# Patient Record
Sex: Female | Born: 1995
Health system: Southern US, Community
[De-identification: ages and names within clinical notes are randomized; demographics above are authoritative.]

## PROBLEM LIST (undated history)

## (undated) ENCOUNTER — Emergency Department (HOSPITAL_COMMUNITY): Admission: EM | Payer: Self-pay | Source: Home / Self Care

## (undated) ENCOUNTER — Inpatient Hospital Stay (HOSPITAL_COMMUNITY): Payer: Self-pay

## (undated) DIAGNOSIS — Z639 Problem related to primary support group, unspecified: Secondary | ICD-10-CM

## (undated) DIAGNOSIS — L309 Dermatitis, unspecified: Secondary | ICD-10-CM

## (undated) DIAGNOSIS — D649 Anemia, unspecified: Secondary | ICD-10-CM

## (undated) HISTORY — PX: OTHER SURGICAL HISTORY: SHX169

## (undated) HISTORY — DX: Problem related to primary support group, unspecified: Z63.9

---
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
## 2015-01-18 ENCOUNTER — Ambulatory Visit (INDEPENDENT_AMBULATORY_CARE_PROVIDER_SITE_OTHER): Payer: BLUE CROSS/BLUE SHIELD | Admitting: Family Medicine

## 2015-01-18 VITALS — BP 126/80 | HR 85 | Temp 97.9°F | Resp 17 | Ht 65.0 in | Wt 186.0 lb

## 2015-01-18 DIAGNOSIS — Z3009 Encounter for other general counseling and advice on contraception: Secondary | ICD-10-CM | POA: Diagnosis not present

## 2015-01-18 DIAGNOSIS — L309 Dermatitis, unspecified: Secondary | ICD-10-CM | POA: Diagnosis not present

## 2015-01-18 DIAGNOSIS — N898 Other specified noninflammatory disorders of vagina: Secondary | ICD-10-CM

## 2015-01-18 LAB — POCT URINE PREGNANCY: PREG TEST UR: NEGATIVE

## 2015-01-18 LAB — POC MICROSCOPIC URINALYSIS (UMFC): Mucus: ABSENT

## 2015-01-18 LAB — POCT URINALYSIS DIP (MANUAL ENTRY)
Bilirubin, UA: NEGATIVE
Blood, UA: NEGATIVE
Glucose, UA: NEGATIVE
Leukocytes, UA: NEGATIVE
NITRITE UA: NEGATIVE
PH UA: 7
Spec Grav, UA: 1.02
UROBILINOGEN UA: 0.2

## 2015-01-18 LAB — POCT WET + KOH PREP
Trich by wet prep: ABSENT
YEAST BY WET PREP: ABSENT
Yeast by KOH: ABSENT

## 2015-01-18 MED ORDER — HYDROCORTISONE VALERATE 0.2 % EX OINT: 1.0000 "application " | TOPICAL_OINTMENT | Freq: Two times a day (BID) | CUTANEOUS | Status: DC

## 2015-01-18 MED ORDER — TRIAMCINOLONE ACETONIDE 0.1 % EX CREA: 1.0000 "application " | TOPICAL_CREAM | Freq: Four times a day (QID) | CUTANEOUS | Status: DC

## 2015-01-18 MED ORDER — FLUCONAZOLE 150 MG PO TABS: 150.0000 mg | ORAL_TABLET | Freq: Once | ORAL | Status: DC

## 2015-01-18 NOTE — Progress Notes (Signed)
Subjective:    Patient ID: Tammy Cruz, female    DOB: Jun 10, 1995, 19 y.o.   MRN: [id number] By signing my name below, I, Littie Deeds, attest that this documentation has been prepared under the direction and in the presence of Norberto Sorenson, MD.  Electronically Signed: Littie Deeds, Medical Scribe. 01/18/2015. 3:46 PM.  Chief Complaint  Patient presents with  . Eczema    HPI HPI Comments: Stepahnie Cruz is a 19 y.o. female with a history of eczema who presents to the [facility] complaining of gradual onset, eczematous rash to her bilateral arms and legs that started a few weeks ago after she ran out of her hydrocortisone valerate. Patient notes that she has bad breakouts of eczema when she runs out of the medication; however, she notes that her current breakout is worse than usual. She has been using OTC hydrocortisone and Vaseline but without relief.  Patient also reports having heavy, non-odorous vaginal discharge that started a few weeks ago. She has not tried any OTC medications. She denies vaginal itching, vaginal pain, and dysuria. Patient is sexually active, but she is not currently on birth control and does not remember when her last menstrual period was. She does have a history of a miscarriage. She has not had recent STD testing.   No past medical history on file. No past surgical history on file. No current outpatient prescriptions on file prior to visit.   No current facility-administered medications on file prior to visit.   Allergies  Allergen Reactions  . Protopic [Tacrolimus] Rash   Family History  Problem Relation Age of Onset  . Hypertension Mother   . Hypertension Father   . Diabetes Maternal Grandmother   . Diabetes Maternal Grandfather    Social History   Social History  . Marital Status: Single    Spouse Name: n/a  . Number of Children: 0  . Years of Education: N/A   Occupational History  . Camera operator   Social History Main Topics  . Smoking status: Never Smoker   . Smokeless tobacco: Never Used  . Alcohol Use: No  . Drug Use: No  . Sexual Activity:    Partners: Male    Birth Control/ Protection: Condom   Other Topics Concern  . None   Social History Narrative   Lives with her mother and sister.   Her brother lives with their father in [place].        Review of Systems  Constitutional: Negative for fever and chills.  Genitourinary: Positive for vaginal discharge and menstrual problem. Negative for dysuria, frequency, hematuria, vaginal bleeding, genital sores, vaginal pain, pelvic pain and dyspareunia.  Musculoskeletal: Negative for arthralgias.  Skin: Positive for rash and wound. Negative for pallor.  Allergic/Immunologic: Negative for immunocompromised state.  Neurological: Negative for numbness.       Objective:  BP 126/80 mmHg  Pulse 85  Temp(Src) 97.9 F (36.6 C) (Oral)  Resp 17  Ht  (1.651 m)  Wt 186 lb (84.369 kg)  BMI 30.95 kg/m2  SpO2 99%  LMP 12/28/2014  Physical Exam  Constitutional: She is oriented to person, place, and time. She appears well-developed and well-nourished. No distress.  HENT:  Head: Normocephalic and atraumatic.  Mouth/Throat: Oropharynx is clear and moist. No oropharyngeal exudate.  Eyes: Pupils are equal, round, and reactive to light.  Neck: Neck supple.  Cardiovascular: Normal rate.   Pulmonary/Chest: Effort normal.  Genitourinary:  There is no rash, tenderness or lesion on the right labia. There is no rash, tenderness or lesion on the left labia. Uterus is tender. Uterus is not deviated, not enlarged and not fixed. Cervix exhibits discharge. Cervix exhibits no motion tenderness and no friability. Right adnexum displays no mass, no tenderness and no fullness. Left adnexum displays no mass, no tenderness and no fullness. No erythema or tenderness in the vagina. Vaginal discharge found.  Musculoskeletal: She exhibits no  edema.  Neurological: She is alert and oriented to person, place, and time. No cranial nerve deficit.  Skin: Skin is warm and dry. Rash noted.  Well-defined, hyperpigmented patches of thickened skin with some excoriations and roughness.   Psychiatric: She has a normal mood and affect. Her behavior is normal.  Nursing note and vitals reviewed.      Results for orders placed or performed in visit on 01/18/15  POCT urine pregnancy  Result Value Ref Range   Preg Test, Ur Negative Negative  POCT urinalysis dipstick  Result Value Ref Range   Color, UA yellow yellow   Clarity, UA clear clear   Glucose, UA negative negative   Bilirubin, UA negative negative   Ketones, POC UA trace (5) (A) negative   Spec Grav, UA 1.020    Blood, UA negative negative   pH, UA 7.0    Protein Ur, POC trace (A) negative   Urobilinogen, UA 0.2    Nitrite, UA Negative Negative   Leukocytes, UA Negative Negative  POCT Microscopic Urinalysis ([facility])  Result Value Ref Range   WBC,UR,HPF,POC None None WBC/hpf   RBC,UR,HPF,POC None None RBC/hpf   Bacteria None None, Too numerous to count   Mucus Absent Absent   Epithelial Cells, UR Per Microscopy Few (A) None, Too numerous to count cells/hpf  POCT Wet + KOH Prep  Result Value Ref Range   Yeast by KOH Absent Present, Absent   Yeast by wet prep Absent Present, Absent   WBC by wet prep Few None, Few, Too numerous to count   Clue Cells Wet Prep HPF POC Few (A) None, Too numerous to count   Trich by wet prep Absent Present, Absent   Bacteria Wet Prep HPF POC Moderate (A) None, Few, Too numerous to count   Epithelial Cells By [employer] Financial[employer]et Pref ([facility]) Moderate (A) None, Few, Too numerous to count   RBC,UR,HPF,POC None None RBC/hpf   Assessment & Plan:   1. Eczema - normally well-controlled with westcort but severe flair when she ran out so try triamcinolone then step down when sxs improve. Warned of side effects of steroid overuse, use good moisturizer when sxs well  controlled.  2. Vaginal discharge - wet prep nml but clinically appears to be yeast so will trx w/ diflucan  3. Family planning - strongly encouraged pt to consider birth control - is sexually active and does not monitor period frequency but she declines and would not be upset if she was pregnant    Orders Placed This Encounter  Procedures  . GC/Chlamydia Probe Amp  . POCT urine pregnancy  . POCT urinalysis dipstick  . POCT Microscopic Urinalysis ([facility])  . POCT Wet + KOH Prep    Meds ordered this encounter  Medications  . triamcinolone cream (KENALOG) 0.1 %    Sig: Apply 1 application topically 4 (four) times daily.    Dispense:  453.6 g    Refill:  0  . hydrocortisone valerate ointment (WESTCORT) 0.2 %    Sig: Apply 1  application topically 2 (two) times daily.    Dispense:  60 g    Refill:  5  . fluconazole (DIFLUCAN) 150 MG tablet    Sig: Take 1 tablet (150 mg total) by mouth once. Repeat if needed    Dispense:  1 tablet    Refill:  0    I personally performed the services described in this documentation, which was scribed in my presence. The recorded information has been reviewed and considered, and addended by me as needed.  Norberto Sorenson, MD MPH

## 2015-01-18 NOTE — Patient Instructions (Addendum)
Use the triamcinolone several times a day while the eczema is flaired up.  THen has it calms down switch back to the hydrocortisone valearte. Do not overuse the steroids or they can cause light patches in your skin.  Eczema Eczema, also called atopic dermatitis, is a skin disorder that causes inflammation of the skin. It causes a red rash and dry, scaly skin. The skin becomes very itchy. Eczema is generally worse during the cooler winter months and often improves with the warmth of summer. Eczema usually starts showing signs in infancy. Some children outgrow eczema, but it may last through adulthood.  CAUSES  The exact cause of eczema is not known, but it appears to run in families. People with eczema often have a family history of eczema, allergies, asthma, or hay fever. Eczema is not contagious. Flare-ups of the condition may be caused by:   Contact with something you are sensitive or allergic to.   Stress. SIGNS AND SYMPTOMS  Dry, scaly skin.   Red, itchy rash.   Itchiness. This may occur before the skin rash and may be very intense.  DIAGNOSIS  The diagnosis of eczema is usually made based on symptoms and medical history. TREATMENT  Eczema cannot be cured, but symptoms usually can be controlled with treatment and other strategies. A treatment plan might include:  Controlling the itching and scratching.   Use over-the-counter antihistamines as directed for itching. This is especially useful at night when the itching tends to be worse.   Use over-the-counter steroid creams as directed for itching.   Avoid scratching. Scratching makes the rash and itching worse. It may also result in a skin infection (impetigo) due to a break in the skin caused by scratching.   Keeping the skin well moisturized with creams every day. This will seal in moisture and help prevent dryness. Lotions that contain alcohol and water should be avoided because they can dry the skin.   Limiting  exposure to things that you are sensitive or allergic to (allergens).   Recognizing situations that cause stress.   Developing a plan to manage stress.  HOME CARE INSTRUCTIONS   Only take over-the-counter or prescription medicines as directed by your health care provider.   Do not use anything on the skin without checking with your health care provider.   Keep baths or showers short (5 minutes) in warm (not hot) water. Use mild cleansers for bathing. These should be unscented. You may add nonperfumed bath oil to the bath water. It is best to avoid soap and bubble bath.   Immediately after a bath or shower, when the skin is still damp, apply a moisturizing ointment to the entire body. This ointment should be a petroleum ointment. This will seal in moisture and help prevent dryness. The thicker the ointment, the better. These should be unscented.   Keep fingernails cut short. Children with eczema may need to wear soft gloves or mittens at night after applying an ointment.   Dress in clothes made of cotton or cotton blends. Dress lightly, because heat increases itching.   A child with eczema should stay away from anyone with fever blisters or cold sores. The virus that causes fever blisters (herpes simplex) can cause a serious skin infection in children with eczema. SEEK MEDICAL CARE IF:   Your itching interferes with sleep.   Your rash gets worse or is not better within 1 week after starting treatment.   You see pus or soft yellow scabs in the rash  area.   You have a fever.   You have a rash flare-up after contact with someone who has fever blisters.    This information is not intended to replace advice given to you by your health care provider. Make sure you discuss any questions you have with your health care provider.   Document Released: 01/03/2000 Document Revised: 10/26/2012 Document Reviewed: 08/08/2012 Elsevier Interactive Patient Education 2016 Else[employer]  Inc[employer]  Natural Family Planning Natural Family Planning (NFP) is a type of birth control without using any form of contraception. Women who use NFP should not have sexual intercourse when the ovary produces an egg (ovulation) during the menstrual cycle. The NFP method is safe and can prevent pregnancy. It is 75% effective when practiced right. The man needs to also understand this method of birth control and the woman needs to be aware of how her body functions during her menstrual cycle. NFP can also be used as a method of getting pregnant.  HOW THE NFP METHOD WORKS  A woman's menstrual period usually happens every 28-30 days (it can vary from 23-35 days).  Ovulation happens 12-14 days before the start of the next menstrual period (the fertile period). The egg is fertile for 24 hours and the sperm can live for 3 days or more. If there is sexual intercourse at this time, pregnancy can occur. THERE ARE MANY TYPES OF NFP METHODS USED TO PREVENT PREGNANCY  The basal body temperature method. Often times, there is a slight increase of body temperature when a woman ovulates. Take your temperature every morning before getting out of bed. Write the temperature on a chart. An increase in the temperature shows ovulation has happened. Do not have sexual intercourse from the menstrual period up to three days after the increase in the temperature. Note that the body temperature may increase as a result of fever, restless sleep, and working schedules.  The ovulation cervical mucus method. During the menstrual cycle, the cervical mucus changes from dry and sticky to wet and slippery. Check the mucus of the vagina every day to look for these changes. Just before ovulation, the mucus becomes wet and slippery. On the last day of wetness, ovulation happens. To avoid getting pregnant, sexual intercourse is safe for about 10 days after the menstrual period and on the dry mucus days. Do not have sexual intercourse when the  mucus starts to show up and not until 4 days after the wet and slippery mucus goes away. Sexual intercourse after the 4 days have passed until the menstrual period starts is a safe time. Note that the mucus from the vagina can increase because of a vaginal or cervical infection, lubricants, some medicines, and sexual excitement.  The symptothermal method. This method uses both the temperature and the ovulation methods. Combine the two methods above to prevent pregnancy.  The calendar method. Record your menstrual periods and length of the cycles for 6 months. This is helpful when the menstrual cycle varies in the length of the cycle. The length of a menstrual cycle is from day 1 of the present menstrual period to day 1 of the next menstrual period. Then, find your fertile days of the month and do not have sexual intercourse during that time. You may need help from your health care provider to find out your fertile days. There are some signs of ovulation that may be helpful when trying to find the time of ovulation. This includes vaginal spotting or abdominal cramps during the middle of  your menstrual cycle. Not all women have these symptoms. YOU SHOULD NOT USE NFP IF:  You have very irregular menstrual periods and may skip months.  You have abnormal bleeding.  You have a vaginal or cervical infection.  You are on medicines that can affect the vaginal mucus or body temperature. These medicines include antibiotics, thyroid medicines, and antihistamines (cold and allergy medicine).   This information is not intended to replace advice given to you by your health care provider. Make sure you discuss any questions you have with your health care provider.   Document Released: 06/24/2007 Document Revised: 01/10/2013 Document Reviewed: 07/08/2012 Elsevier Interactive Patient Education [employer] Contraception Choices Contraception (birth control) is the use of any methods or devices to prevent  pregnancy. Below are some methods to help avoid pregnancy. HORMONAL METHODS   Contraceptive implant. This is a thin, plastic tube containing progesterone hormone. It does not contain estrogen hormone. Your health care provider inserts the tube in the inner part of the upper arm. The tube can remain in place for up to 3 years. After 3 years, the implant must be removed. The implant prevents the ovaries from releasing an egg (ovulation), thickens the cervical mucus to prevent sperm from entering the uterus, and thins the lining of the inside of the uterus.  Progesterone-only injections. These injections are given every 3 months by your health care provider to prevent pregnancy. This synthetic progesterone hormone stops the ovaries from releasing eggs. It also thickens cervical mucus and changes the uterine lining. This makes it harder for sperm to survive in the uterus.  Birth control pills. These pills contain estrogen and progesterone hormone. They work by preventing the ovaries from releasing eggs (ovulation). They also cause the cervical mucus to thicken, preventing the sperm from entering the uterus. Birth control pills are prescribed by a health care provider.Birth control pills can also be used to treat heavy periods.  Minipill. This type of birth control pill contains only the progesterone hormone. They are taken every day of each month and must be prescribed by your health care provider.  Birth control patch. The patch contains hormones similar to those in birth control pills. It must be changed once a week and is prescribed by a health care provider.  Vaginal ring. The ring contains hormones similar to those in birth control pills. It is left in the vagina for 3 weeks, removed for 1 week, and then a new one is put back in place. The patient must be comfortable inserting and removing the ring from the vagina.A health care provider's prescription is necessary.  Emergency contraception.  Emergency contraceptives prevent pregnancy after unprotected sexual intercourse. This pill can be taken right after sex or up to 5 days after unprotected sex. It is most effective the sooner you take the pills after having sexual intercourse. Most emergency contraceptive pills are available without a prescription. Check with your pharmacist. Do not use emergency contraception as your only form of birth control. BARRIER METHODS   Female condom. This is a thin sheath (latex or rubber) that is worn over the penis during sexual intercourse. It can be used with spermicide to increase effectiveness.  Female condom. This is a soft, loose-fitting sheath that is put into the vagina before sexual intercourse.  Diaphragm. This is a soft, latex, dome-shaped barrier that must be fitted by a health care provider. It is inserted into the vagina, along with a spermicidal jelly. It is inserted before intercourse. The diaphragm should  be left in the vagina for 6 to 8 hours after intercourse.  Cervical cap. This is a round, soft, latex or plastic cup that fits over the cervix and must be fitted by a health care provider. The cap can be left in place for up to 48 hours after intercourse.  Sponge. This is a soft, circular piece of polyurethane foam. The sponge has spermicide in it. It is inserted into the vagina after wetting it and before sexual intercourse.  Spermicides. These are chemicals that kill or block sperm from entering the cervix and uterus. They come in the form of creams, jellies, suppositories, foam, or tablets. They do not require a prescription. They are inserted into the vagina with an applicator before having sexual intercourse. The process must be repeated every time you have sexual intercourse. INTRAUTERINE CONTRACEPTION  Intrauterine device (IUD). This is a T-shaped device that is put in a woman's uterus during a menstrual period to prevent pregnancy. There are 2 types:  Copper IUD. This type of IUD  is wrapped in copper wire and is placed inside the uterus. Copper makes the uterus and fallopian tubes produce a fluid that kills sperm. It can stay in place for 10 years.  Hormone IUD. This type of IUD contains the hormone progestin (synthetic progesterone). The hormone thickens the cervical mucus and prevents sperm from entering the uterus, and it also thins the uterine lining to prevent implantation of a fertilized egg. The hormone can weaken or kill the sperm that get into the uterus. It can stay in place for 3-5 years, depending on which type of IUD is used. PERMANENT METHODS OF CONTRACEPTION  Female tubal ligation. This is when the woman's fallopian tubes are surgically sealed, tied, or blocked to prevent the egg from traveling to the uterus.  Hysteroscopic sterilization. This involves placing a small coil or insert into each fallopian tube. Your doctor uses a technique called hysteroscopy to do the procedure. The device causes scar tissue to form. This results in permanent blockage of the fallopian tubes, so the sperm cannot fertilize the egg. It takes about 3 months after the procedure for the tubes to become blocked. You must use another form of birth control for these 3 months.  Female sterilization. This is when the female has the tubes that carry sperm tied off (vasectomy).This blocks sperm from entering the vagina during sexual intercourse. After the procedure, the man can still ejaculate fluid (semen). NATURAL PLANNING METHODS  Natural family planning. This is not having sexual intercourse or using a barrier method (condom, diaphragm, cervical cap) on days the woman could become pregnant.  Calendar method. This is keeping track of the length of each menstrual cycle and identifying when you are fertile.  Ovulation method. This is avoiding sexual intercourse during ovulation.  Symptothermal method. This is avoiding sexual intercourse during ovulation, using a thermometer and ovulation  symptoms.  Post-ovulation method. This is timing sexual intercourse after you have ovulated. Regardless of which type or method of contraception you choose, it is important that you use condoms to protect against the transmission of sexually transmitted infections (STIs). Talk with your health care provider about which form of contraception is most appropriate for you.   This information is not intended to replace advice given to you by your health care provider. Make sure you discuss any questions you have with your health care provider.   Document Released: 01/05/2005 Document Revised: 01/10/2013 Document Reviewed: 06/30/2012 Elsevier Interactive Patient Education [employer]

## 2015-01-22 ENCOUNTER — Other Ambulatory Visit: Payer: Self-pay

## 2015-01-22 LAB — GC/CHLAMYDIA PROBE AMP
CT Probe RNA: NOT DETECTED
GC Probe RNA: NOT DETECTED

## 2015-01-22 MED ORDER — HYDROCORTISONE VALERATE 0.2 % EX OINT: 1.0000 "application " | TOPICAL_OINTMENT | Freq: Two times a day (BID) | CUTANEOUS | Status: DC

## 2015-01-22 MED ORDER — FLUCONAZOLE 150 MG PO TABS: 150.0000 mg | ORAL_TABLET | Freq: Once | ORAL | Status: DC

## 2015-01-22 MED ORDER — TRIAMCINOLONE ACETONIDE 0.1 % EX CREA: 1.0000 "application " | TOPICAL_CREAM | Freq: Four times a day (QID) | CUTANEOUS | Status: DC

## 2015-01-22 NOTE — Telephone Encounter (Signed)
Pt called and asked that we send in these Rxs to [facility] in [place] instead of [place] because they keep saying they didn't get the Rxs. [facility].

## 2015-01-23 ENCOUNTER — Encounter: Payer: Self-pay | Admitting: Family Medicine

## 2015-07-13 ENCOUNTER — Emergency Department (HOSPITAL_COMMUNITY)
Admission: EM | Admit: 2015-07-13 | Discharge: 2015-07-13 | Disposition: A | Discharge status: Home / Self Care | Payer: BLUE CROSS/BLUE SHIELD | Attending: Emergency Medicine | Admitting: Emergency Medicine

## 2015-07-13 ENCOUNTER — Encounter (HOSPITAL_COMMUNITY): Payer: Self-pay

## 2015-07-13 DIAGNOSIS — N3090 Cystitis, unspecified without hematuria: Secondary | ICD-10-CM | POA: Insufficient documentation

## 2015-07-13 DIAGNOSIS — B9689 Other specified bacterial agents as the cause of diseases classified elsewhere: Secondary | ICD-10-CM

## 2015-07-13 DIAGNOSIS — N898 Other specified noninflammatory disorders of vagina: Secondary | ICD-10-CM | POA: Insufficient documentation

## 2015-07-13 DIAGNOSIS — N760 Acute vaginitis: Secondary | ICD-10-CM

## 2015-07-13 LAB — POC URINE PREG, ED: Preg Test, Ur: NEGATIVE

## 2015-07-13 LAB — WET PREP, GENITAL
Sperm: NONE SEEN
TRICH WET PREP: NONE SEEN
YEAST WET PREP: NONE SEEN

## 2015-07-13 LAB — URINE MICROSCOPIC-ADD ON

## 2015-07-13 LAB — URINALYSIS, ROUTINE W REFLEX MICROSCOPIC
Bilirubin Urine: NEGATIVE
Glucose, UA: NEGATIVE mg/dL
Hgb urine dipstick: NEGATIVE
Ketones, ur: NEGATIVE mg/dL
Nitrite: NEGATIVE
Protein, ur: NEGATIVE mg/dL
Specific Gravity, Urine: 1.023 (ref 1.005–1.030)
pH: 7 (ref 5.0–8.0)

## 2015-07-13 MED ORDER — LIDOCAINE HCL (PF) 1 % IJ SOLN
INTRAMUSCULAR | Status: AC
  Filled 2015-07-13: qty 5

## 2015-07-13 MED ORDER — AZITHROMYCIN 250 MG PO TABS
1000.0000 mg | ORAL_TABLET | Freq: Once | ORAL | Status: AC
  Administered 2015-07-13: 1000 mg via ORAL
  Filled 2015-07-13: qty 4

## 2015-07-13 MED ORDER — LIDOCAINE HCL (PF) 1 % IJ SOLN
2.0000 mL | Freq: Once | INTRAMUSCULAR | Status: AC
  Administered 2015-07-13: 2 mL
  Filled 2015-07-13: qty 5

## 2015-07-13 MED ORDER — CEPHALEXIN 500 MG PO CAPS: 500.0000 mg | ORAL_CAPSULE | Freq: Four times a day (QID) | ORAL | Status: DC

## 2015-07-13 MED ORDER — ONDANSETRON 4 MG PO TBDP: 4.0000 mg | ORAL_TABLET | Freq: Three times a day (TID) | ORAL | Status: DC | PRN

## 2015-07-13 MED ORDER — CEFTRIAXONE SODIUM 250 MG IJ SOLR
250.0000 mg | Freq: Once | INTRAMUSCULAR | Status: AC
  Administered 2015-07-13: 250 mg via INTRAMUSCULAR
  Filled 2015-07-13: qty 250

## 2015-07-13 MED ORDER — METRONIDAZOLE 500 MG PO TABS: 500.0000 mg | ORAL_TABLET | Freq: Two times a day (BID) | ORAL | Status: DC

## 2015-07-13 NOTE — Discharge Instructions (Signed)
Vaginitis °Vaginitis is an inflammation of the vagina. It is most often caused by a change in the normal balance of the bacteria and yeast that live in the vagina. This change in balance causes an overgrowth of certain bacteria or yeast, which causes the inflammation. There are different types of vaginitis, but the most common types are: °· Bacterial vaginosis. °· Yeast infection (candidiasis). °· Trichomoniasis vaginitis. This is a sexually transmitted infection (STI). °· Viral vaginitis. °· Atrophic vaginitis. °· Allergic vaginitis. °CAUSES  °The cause depends on the type of vaginitis. Vaginitis can be caused by: °· Bacteria (bacterial vaginosis). °· Yeast (yeast infection). °· A parasite (trichomoniasis vaginitis) °· A virus (viral vaginitis). °· Low hormone levels (atrophic vaginitis). Low hormone levels can occur during pregnancy, breastfeeding, or after menopause. °· Irritants, such as bubble baths, scented tampons, and feminine sprays (allergic vaginitis). °Other factors can change the normal balance of the yeast and bacteria that live in the vagina. These include: °· Antibiotic medicines. °· Poor hygiene. °· Diaphragms, vaginal sponges, spermicides, birth control pills, and intrauterine devices (IUD). °· Sexual intercourse. °· Infection. °· Uncontrolled diabetes. °· A weakened immune system. °SYMPTOMS  °Symptoms can vary depending on the cause of the vaginitis. Common symptoms include: °· Abnormal vaginal discharge. °· The discharge is white, gray, or yellow with bacterial vaginosis. °· The discharge is thick, white, and cheesy with a yeast infection. °· The discharge is frothy and yellow or greenish with trichomoniasis. °· A bad vaginal odor. °· The odor is fishy with bacterial vaginosis. °· Vaginal itching, pain, or swelling. °· Painful intercourse. °· Pain or burning when urinating. °Sometimes, there are no symptoms. °TREATMENT  °Treatment will vary depending on the type of infection.  °· Bacterial  vaginosis and trichomoniasis are often treated with antibiotic creams or pills. °· Yeast infections are often treated with antifungal medicines, such as vaginal creams or suppositories. °· Viral vaginitis has no cure, but symptoms can be treated with medicines that relieve discomfort. Your sexual partner should be treated as well. °· Atrophic vaginitis may be treated with an estrogen cream, pill, suppository, or vaginal ring. If vaginal dryness occurs, lubricants and moisturizing creams may help. You may be told to avoid scented soaps, sprays, or douches. °· Allergic vaginitis treatment involves quitting the use of the product that is causing the problem. Vaginal creams can be used to treat the symptoms. °HOME CARE INSTRUCTIONS  °· Take all medicines as directed by your caregiver. °· Keep your genital area clean and dry. Avoid soap and only rinse the area with water. °· Avoid douching. It can remove the healthy bacteria in the vagina. °· Do not use tampons or have sexual intercourse until your vaginitis has been treated. Use sanitary pads while you have vaginitis. °· Wipe from front to back. This avoids the spread of bacteria from the rectum to the vagina. °· Let air reach your genital area. °¨ Wear cotton underwear to decrease moisture buildup. °¨ Avoid wearing underwear while you sleep until your vaginitis is gone. °¨ Avoid tight pants and underwear or nylons without a cotton panel. °¨ Take off wet clothing (especially bathing suits) as soon as possible. °· Use mild, non-scented products. Avoid using irritants, such as: °¨ Scented feminine sprays. °¨ Fabric softeners. °¨ Scented detergents. °¨ Scented tampons. °¨ Scented soaps or bubble baths. °· Practice safe sex and use condoms. Condoms may prevent the spread of trichomoniasis and viral vaginitis. °SEEK MEDICAL CARE IF:  °· You have abdominal pain. °· You   have a fever or persistent symptoms for more than 2-3 days.  You have a fever and your symptoms suddenly  get worse.   This information is not intended to replace advice given to you by your health care provider. Make sure you discuss any questions you have with your health care provider.   Document Released: 11/02/2006 Document Revised: 05/22/2014 Document Reviewed: 06/18/2011 Elsevier Interactive Patient Education 2016 Elsevier Inc.  Urinary Tract Infection Urinary tract infections (UTIs) can develop anywhere along your urinary tract. Your urinary tract is your body's drainage system for removing wastes and extra water. Your urinary tract includes two kidneys, two ureters, a bladder, and a urethra. Your kidneys are a pair of bean-shaped organs. Each kidney is about the size of your fist. They are located below your ribs, one on each side of your spine. CAUSES Infections are caused by microbes, which are microscopic organisms, including fungi, viruses, and bacteria. These organisms are so small that they can only be seen through a microscope. Bacteria are the microbes that most commonly cause UTIs. SYMPTOMS  Symptoms of UTIs may vary by age and gender of the patient and by the location of the infection. Symptoms in young women typically include a frequent and intense urge to urinate and a painful, burning feeling in the bladder or urethra during urination. Older women and men are more likely to be tired, shaky, and weak and have muscle aches and abdominal pain. A fever may mean the infection is in your kidneys. Other symptoms of a kidney infection include pain in your back or sides below the ribs, nausea, and vomiting. DIAGNOSIS To diagnose a UTI, your caregiver will ask you about your symptoms. Your caregiver will also ask you to provide a urine sample. The urine sample will be tested for bacteria and white blood cells. White blood cells are made by your body to help fight infection. TREATMENT  Typically, UTIs can be treated with medication. Because most UTIs are caused by a bacterial infection, they  usually can be treated with the use of antibiotics. The choice of antibiotic and length of treatment depend on your symptoms and the type of bacteria causing your infection. HOME CARE INSTRUCTIONS  If you were prescribed antibiotics, take them exactly as your caregiver instructs you. Finish the medication even if you feel better after you have only taken some of the medication.  Drink enough water and fluids to keep your urine clear or pale yellow.  Avoid caffeine, tea, and carbonated beverages. They tend to irritate your bladder.  Empty your bladder often. Avoid holding urine for long periods of time.  Empty your bladder before and after sexual intercourse.  After a bowel movement, women should cleanse from front to back. Use each tissue only once. SEEK MEDICAL CARE IF:   You have back pain.  You develop a fever.  Your symptoms do not begin to resolve within 3 days. SEEK IMMEDIATE MEDICAL CARE IF:   You have severe back pain or lower abdominal pain.  You develop chills.  You have nausea or vomiting.  You have continued burning or discomfort with urination. MAKE SURE YOU:   Understand these instructions.  Will watch your condition.  Will get help right away if you are not doing well or get worse.   This information is not intended to replace advice given to you by your health care provider. Make sure you discuss any questions you have with your health care provider.   Document Released: 10/15/2004 Document  Revised: 09/26/2014 Document Reviewed: 02/13/2011 Elsevier Interactive Patient Education 2016 Elsevier Inc.  Bacterial Vaginosis Bacterial vaginosis is a vaginal infection that occurs when the normal balance of bacteria in the vagina is disrupted. It results from an overgrowth of certain bacteria. This is the most common vaginal infection in women of childbearing age. Treatment is important to prevent complications, especially in pregnant women, as it can cause a  premature delivery. CAUSES  Bacterial vaginosis is caused by an increase in harmful bacteria that are normally present in smaller amounts in the vagina. Several different kinds of bacteria can cause bacterial vaginosis. However, the reason that the condition develops is not fully understood. RISK FACTORS Certain activities or behaviors can put you at an increased risk of developing bacterial vaginosis, including:  Having a new sex partner or multiple sex partners.  Douching.  Using an intrauterine device (IUD) for contraception. Women do not get bacterial vaginosis from toilet seats, bedding, swimming pools, or contact with objects around them. SIGNS AND SYMPTOMS  Some women with bacterial vaginosis have no signs or symptoms. Common symptoms include:  Grey vaginal discharge.  A fishlike odor with discharge, especially after sexual intercourse.  Itching or burning of the vagina and vulva.  Burning or pain with urination. DIAGNOSIS  Your health care provider will take a medical history and examine the vagina for signs of bacterial vaginosis. A sample of vaginal fluid may be taken. Your health care provider will look at this sample under a microscope to check for bacteria and abnormal cells. A vaginal pH test may also be done.  TREATMENT  Bacterial vaginosis may be treated with antibiotic medicines. These may be given in the form of a pill or a vaginal cream. A second round of antibiotics may be prescribed if the condition comes back after treatment. Because bacterial vaginosis increases your risk for sexually transmitted diseases, getting treated can help reduce your risk for chlamydia, gonorrhea, HIV, and herpes. HOME CARE INSTRUCTIONS   Only take over-the-counter or prescription medicines as directed by your health care provider.  If antibiotic medicine was prescribed, take it as directed. Make sure you finish it even if you start to feel better.  Tell all sexual partners that you  have a vaginal infection. They should see their health care provider and be treated if they have problems, such as a mild rash or itching.  During treatment, it is important that you follow these instructions:  Avoid sexual activity or use condoms correctly.  Do not douche.  Avoid alcohol as directed by your health care provider.  Avoid breastfeeding as directed by your health care provider. SEEK MEDICAL CARE IF:   Your symptoms are not improving after 3 days of treatment.  You have increased discharge or pain.  You have a fever. MAKE SURE YOU:   Understand these instructions.  Will watch your condition.  Will get help right away if you are not doing well or get worse. FOR MORE INFORMATION  Centers for Disease Control and Prevention, Division of STD Prevention: Solution[employer]s.co.zawww.cdc.gov/std American Sexual Health Association (ASHA): www.ashastd.org   This information is not intended to replace advice given to you by your health care provider. Make sure you discuss any questions you have with your health care provider.   Document Released: 01/05/2005 Document Revised: 01/26/2014 Document Reviewed: 08/17/2012 Elsevier Interactive Patient Education [employer]2016 Elsevier Inc.

## 2015-07-13 NOTE — ED Notes (Signed)
Patient complains of lower abdominal pain, discharge and dysuria x 10 days.

## 2015-07-13 NOTE — ED Provider Notes (Signed)
CSN: [id number]     Arrival date & time 07/13/15  2127 History   First MD Initiated Contact with Patient 07/13/15 2211     Chief Complaint  Patient presents with  . Dysuria  . Vaginal Discharge     (Consider location/radiation/quality/duration/timing/severity/associated sxs/prior Treatment) HPI   The patient is a 20 year old female who presents the emergency department and requests evaluation for vaginal discharge and dysuria which she's had for the past 2 weeks.  She describes her dysuria as suprapubic pressure and discomfort when she urinates she denies hematuria, urinary frequency or urgency.  She scratched her vaginal discharge is increased volume, white with clumps and it with mild associated vaginal irritation. She denies any genital rash or lesions. Her last menstrual period was 3 weeks ago. She denies any vaginal itching. She is sexually active with one partner for the past 2 years but suspects he may have other partners and she is concerned about STD exposure. She has had Trichomonas and gonorrhea infections in the past. She last had sex 2 days ago and denies dyspareunia. She denies abdominal pain and flank pain, fever, sweats, chills, nausea, vomiting. History reviewed. No pertinent past medical history. History reviewed. No pertinent past surgical history. Family History  Problem Relation Age of Onset  . Hypertension Mother   . Hypertension Father   . Diabetes Maternal Grandmother   . Diabetes Maternal Grandfather    Social History  Substance Use Topics  . Smoking status: Never Smoker   . Smokeless tobacco: Never Used  . Alcohol Use: No   OB History    No data available     Review of Systems  All other systems reviewed and are negative.     Allergies  Protopic  Home Medications   Prior to Admission medications   Medication Sig Start Date End Date Taking? Authorizing Provider  cephALEXin (KEFLEX) 500 MG capsule Take 1 capsule (500 mg total) by mouth 4 (four)  times daily. 07/13/15   Danelle BerryLeisa Raynald Rouillard, PA-C  metroNIDAZOLE (FLAGYL) 500 MG tablet Take 1 tablet (500 mg total) by mouth 2 (two) times daily. 07/13/15   Danelle BerryLeisa Sorayah Schrodt, PA-C  ondansetron (ZOFRAN ODT) 4 MG disintegrating tablet Take 1 tablet (4 mg total) by mouth every 8 (eight) hours as needed for nausea or vomiting. 07/13/15   Danelle BerryLeisa Wendell Nicoson, PA-C   BP 137/82 mmHg  Pulse 89  Temp(Src) 98 F (36.7 C) (Oral)  Resp 16  Ht 5\' 4"  (1.626 m)  Wt 81.647 kg  BMI 30.88 kg/m2  SpO2 100%  LMP 06/20/2015 Physical Exam  Constitutional: She is oriented to person, place, and time. She appears well-developed and well-nourished. No distress.  HENT:  Head: Normocephalic and atraumatic.  Nose: Nose normal.  Eyes: Conjunctivae and EOM are normal. Pupils are equal, round, and reactive to light. Right eye exhibits no discharge. Left eye exhibits no discharge. No scleral icterus.  Neck: Normal range of motion. Neck supple.  Cardiovascular: Normal rate and regular rhythm.   Pulmonary/Chest: Effort normal. No stridor. No respiratory distress.  Abdominal: Soft. Bowel sounds are normal. She exhibits no distension. There is no tenderness. There is no rebound and no guarding.  No CVA tenderness  Genitourinary: Vagina normal and uterus normal. There is no rash, tenderness, lesion or injury on the right labia. There is no rash, tenderness, lesion or injury on the left labia. Cervix exhibits no motion tenderness. Right adnexum displays no mass, no tenderness and no fullness. Left adnexum displays no mass, no tenderness and  no fullness. No tenderness in the vagina.  Inspection of external genitalia normal, no visible discharge Bimanual exam allowed only, no direct visualization of cervix with speculum pelvic exam - pt refused  Musculoskeletal: Normal range of motion.  Neurological: She is alert and oriented to person, place, and time. She exhibits normal muscle tone. Coordination normal.  Skin: Skin is warm and dry. She is not  diaphoretic. No erythema.  Psychiatric: She has a normal mood and affect. Her behavior is normal. Judgment and thought content normal.  Nursing note and vitals reviewed.   ED Course  Procedures (including critical care time) Labs Review Labs Reviewed  WET PREP, GENITAL - Abnormal; Notable for the following:    Clue Cells Wet Prep HPF POC PRESENT (*)    WBC, Wet Prep HPF POC MANY (*)    All other components within normal limits  URINALYSIS, ROUTINE W REFLEX MICROSCOPIC (NOT AT [facility]) - Abnormal; Notable for the following:    APPearance CLOUDY (*)    Leukocytes, UA SMALL (*)    All other components within normal limits  URINE MICROSCOPIC-ADD ON - Abnormal; Notable for the following:    Squamous Epithelial / LPF 0-5 (*)    Bacteria, UA RARE (*)    All other components within normal limits  RPR  HIV ANTIBODY (ROUTINE TESTING)  POC URINE PREG, ED  GC/CHLAMYDIA PROBE AMP ([place]) NOT [facility]    Imaging Review No results found. I have personally reviewed and evaluated these images and lab results as part of my medical decision-making.   EKG Interpretation None      MDM   Pt with dysuria, suprapubic pressure and discomfort when urinating, vaginal discharge x 2 weeks, requests STD testing  Pt is well appearing, STD labs obtained.  Pt allowed only vaginal swab testing and bimanual exam.  No CMT or adnexal tenderness.  No abdominal tenderness on exam.  Wet prep + for clue cells and WBC's, UA with small leukocytes, rare bacteria.  Remaining SD testing pending. Patient was given azithromycin and Rocephin in the ER to cover for STD exposure.  He'll be discharged home with treatment for BV and will cover patient with Keflex for cystitis.  At this time there does not appear to be any evidence of an acute emergency medical condition and the patient appears stable for discharge with appropriate outpatient follow up.  Diagnosis was discussed with patient who verbalizes understanding and  is agreeable to discharge.   Pt discharged home in good condition, VSS.  Final diagnoses:  Vaginal discharge  BV (bacterial vaginosis)  Cystitis      Danelle BerryLeisa Rozelia Catapano, PA-C 07/13/15 2350  Raeford RazorStephen Kohut, MD 07/16/15 1140

## 2015-07-15 LAB — HIV ANTIBODY (ROUTINE TESTING W REFLEX): HIV SCREEN 4TH GENERATION: NONREACTIVE

## 2015-07-15 LAB — RPR: RPR Ser Ql: NONREACTIVE

## 2015-07-15 LAB — GC/CHLAMYDIA PROBE AMP (~~LOC~~) NOT AT ARMC
CHLAMYDIA, DNA PROBE: NEGATIVE
NEISSERIA GONORRHEA: NEGATIVE

## 2015-09-25 ENCOUNTER — Emergency Department (HOSPITAL_BASED_OUTPATIENT_CLINIC_OR_DEPARTMENT_OTHER)
Admission: EM | Admit: 2015-09-25 | Discharge: 2015-09-25 | Disposition: A | Discharge status: Home / Self Care | Payer: BLUE CROSS/BLUE SHIELD | Attending: Emergency Medicine | Admitting: Emergency Medicine

## 2015-09-25 ENCOUNTER — Encounter (HOSPITAL_BASED_OUTPATIENT_CLINIC_OR_DEPARTMENT_OTHER): Payer: Self-pay | Admitting: Emergency Medicine

## 2015-09-25 DIAGNOSIS — B9689 Other specified bacterial agents as the cause of diseases classified elsewhere: Secondary | ICD-10-CM

## 2015-09-25 DIAGNOSIS — N898 Other specified noninflammatory disorders of vagina: Secondary | ICD-10-CM | POA: Diagnosis present

## 2015-09-25 DIAGNOSIS — N760 Acute vaginitis: Secondary | ICD-10-CM | POA: Insufficient documentation

## 2015-09-25 LAB — URINALYSIS, ROUTINE W REFLEX MICROSCOPIC
Bilirubin Urine: NEGATIVE
GLUCOSE, UA: NEGATIVE mg/dL
HGB URINE DIPSTICK: NEGATIVE
KETONES UR: NEGATIVE mg/dL
Nitrite: NEGATIVE
PROTEIN: NEGATIVE mg/dL
Specific Gravity, Urine: 1.011 (ref 1.005–1.030)
pH: 6 (ref 5.0–8.0)

## 2015-09-25 LAB — WET PREP, GENITAL
SPERM: NONE SEEN
TRICH WET PREP: NONE SEEN
Yeast Wet Prep HPF POC: NONE SEEN

## 2015-09-25 LAB — URINE MICROSCOPIC-ADD ON

## 2015-09-25 LAB — PREGNANCY, URINE: PREG TEST UR: NEGATIVE

## 2015-09-25 MED ORDER — METRONIDAZOLE 500 MG PO TABS: 500.0000 mg | ORAL_TABLET | Freq: Two times a day (BID) | ORAL | 0 refills | Status: DC

## 2015-09-25 MED ORDER — CEFTRIAXONE SODIUM 250 MG IJ SOLR
250.0000 mg | Freq: Once | INTRAMUSCULAR | Status: AC
  Administered 2015-09-25: 250 mg via INTRAMUSCULAR
  Filled 2015-09-25: qty 250

## 2015-09-25 MED ORDER — AZITHROMYCIN 250 MG PO TABS
1000.0000 mg | ORAL_TABLET | Freq: Once | ORAL | Status: AC
  Administered 2015-09-25: 1000 mg via ORAL
  Filled 2015-09-25: qty 4

## 2015-09-25 MED FILL — metroNIDAZOLE 500 MG TABS: 500 | 7 days supply | Qty: 14 | Fill #0

## 2015-09-25 NOTE — ED Triage Notes (Signed)
Pt c/o abdominal pain more in LLQ, also reports increased vaginal discharge w/o odor. Had unprotected sex two days ago.

## 2015-09-25 NOTE — ED Notes (Signed)
Pa student  at bedside.

## 2015-09-25 NOTE — Discharge Instructions (Signed)
Flagyl as prescribed.  We will call you if your cultures indicate you require further treatment or action.

## 2015-09-26 LAB — GC/CHLAMYDIA PROBE AMP (~~LOC~~) NOT AT ARMC
CHLAMYDIA, DNA PROBE: NEGATIVE
Neisseria Gonorrhea: NEGATIVE

## 2015-09-26 NOTE — ED Provider Notes (Signed)
[facility]   CSN: [id number] Arrival date & time: 09/25/15  1228     History   Chief Complaint Chief Complaint  Patient presents with  . Abdominal Pain  . Vaginal Discharge    HPI Tammy Cruz is a 20 y.o. female.  Patient is a 20 year old female who presents with complaints of vaginal discharge. This is been present for the past several weeks and worsening. She denies any abdominal pain, fevers, or chills. She is sexually active with 1 partner, but is unsure as to whether or not he has additional partners.   The history is provided by the patient.  Abdominal Pain   This is a new problem. The current episode started more than 1 week ago. The problem occurs constantly. The problem has been gradually worsening. Pertinent negatives include fever and dysuria. Nothing aggravates the symptoms. Nothing relieves the symptoms.  Vaginal Discharge   Associated symptoms include abdominal pain. Pertinent negatives include no fever and no dysuria.    History reviewed. No pertinent past medical history.  There are no active problems to display for this patient.   History reviewed. No pertinent surgical history.  OB History    No data available       Home Medications    Prior to Admission medications   Medication Sig Start Date End Date Taking? Authorizing Provider  cephALEXin (KEFLEX) 500 MG capsule Take 1 capsule (500 mg total) by mouth 4 (four) times daily. 07/13/15   Danelle Berry, PA-C  metroNIDAZOLE (FLAGYL) 500 MG tablet Take 1 tablet (500 mg total) by mouth 2 (two) times daily. One po bid x 7 days 09/25/15   Geoffery Lyons, MD  ondansetron (ZOFRAN ODT) 4 MG disintegrating tablet Take 1 tablet (4 mg total) by mouth every 8 (eight) hours as needed for nausea or vomiting. 07/13/15   Danelle Berry, PA-C    Family History Family History  Problem Relation Age of Onset  . Hypertension Mother   . Hypertension Father   . Diabetes Maternal Grandmother   .  Diabetes Maternal Grandfather     Social History Social History  Substance Use Topics  . Smoking status: Never Smoker  . Smokeless tobacco: Never Used  . Alcohol use No     Allergies   Protopic [tacrolimus]   Review of Systems Review of Systems  Constitutional: Negative for fever.  Gastrointestinal: Positive for abdominal pain.  Genitourinary: Positive for vaginal discharge. Negative for dysuria.     Physical Exam Updated Vital Signs BP 119/70 (BP Location: Left Arm)   Pulse 99   Temp 97.7 F (36.5 C) (Oral)   Resp 16   LMP  (LMP Unknown)   SpO2 99%   Physical Exam  Constitutional: She is oriented to person, place, and time. She appears well-developed and well-nourished. No distress.  HENT:  Head: Normocephalic and atraumatic.  Neck: Normal range of motion. Neck supple.  Cardiovascular: Normal rate and regular rhythm.  Exam reveals no gallop and no friction rub.   No murmur heard. Pulmonary/Chest: Effort normal and breath sounds normal. No respiratory distress. She has no wheezes.  Abdominal: Soft. Bowel sounds are normal. She exhibits no distension. There is no tenderness.  Genitourinary: Vaginal discharge found.  Genitourinary Comments: There is a slight, whitish vaginal discharge present. No adnexal ttp or masses.  (Pelvic exam performed along with student Sharyl Nimrod)  Musculoskeletal: Normal range of motion.  Neurological: She is alert and oriented to person, place, and time.  Skin: Skin is  warm and dry. She is not diaphoretic.  Nursing note and vitals reviewed.    ED Treatments / Results  Labs (all labs ordered are listed, but only abnormal results are displayed) Labs Reviewed  WET PREP, GENITAL - Abnormal; Notable for the following:       Result Value   Clue Cells Wet Prep HPF POC PRESENT (*)    WBC, Wet Prep HPF POC MODERATE (*)    All other components within normal limits  URINALYSIS, ROUTINE W REFLEX MICROSCOPIC (NOT AT [facility]) - Abnormal; Notable  for the following:    Leukocytes, UA SMALL (*)    All other components within normal limits  URINE MICROSCOPIC-ADD ON - Abnormal; Notable for the following:    Squamous Epithelial / LPF 0-5 (*)    Bacteria, UA FEW (*)    All other components within normal limits  PREGNANCY, URINE  GC/CHLAMYDIA PROBE AMP ([place]) NOT AT [facility]    EKG  EKG Interpretation None       Radiology No results found.  Procedures Procedures (including critical care time)  Medications Ordered in ED Medications  cefTRIAXone (ROCEPHIN) injection 250 mg (250 mg Intramuscular Given 09/25/15 1417)  azithromycin (ZITHROMAX) tablet 1,000 mg (1,000 mg Oral Given 09/25/15 1417)     Initial Impression / Assessment and Plan / ED Course  I have reviewed the triage vital signs and the nursing notes.  Pertinent labs & imaging results that were available during my care of the patient were reviewed by me and considered in my medical decision making (see chart for details).  Clinical Course    Wet prep reveals WBC and clue cells.  Will treat with rocephin, zmax pending cultures.  Final Clinical Impressions(s) / ED Diagnoses   Final diagnoses:  Bacterial vaginosis    New Prescriptions Discharge Medication List as of 09/25/2015  2:06 PM       Geoffery Lyonsouglas Crystle Carelli, MD 09/26/15 [phone]

## 2016-10-15 ENCOUNTER — Emergency Department (HOSPITAL_COMMUNITY)
Admission: EM | Admit: 2016-10-15 | Discharge: 2016-10-15 | Disposition: A | Discharge status: Home / Self Care | Payer: BLUE CROSS/BLUE SHIELD | Attending: Emergency Medicine | Admitting: Emergency Medicine

## 2016-10-15 ENCOUNTER — Encounter (HOSPITAL_COMMUNITY): Payer: Self-pay | Admitting: Emergency Medicine

## 2016-10-15 DIAGNOSIS — N946 Dysmenorrhea, unspecified: Secondary | ICD-10-CM | POA: Insufficient documentation

## 2016-10-15 LAB — CBC
HCT: 40.1 % (ref 36.0–46.0)
HEMOGLOBIN: 13.6 g/dL (ref 12.0–15.0)
MCH: 26.9 pg (ref 26.0–34.0)
MCHC: 33.9 g/dL (ref 30.0–36.0)
MCV: 79.4 fL (ref 78.0–100.0)
Platelets: 287 10*3/uL (ref 150–400)
RBC: 5.05 MIL/uL (ref 3.87–5.11)
RDW: 14.9 % (ref 11.5–15.5)
WBC: 7.7 10*3/uL (ref 4.0–10.5)

## 2016-10-15 LAB — URINALYSIS, ROUTINE W REFLEX MICROSCOPIC
Bilirubin Urine: NEGATIVE
GLUCOSE, UA: NEGATIVE mg/dL
Ketones, ur: NEGATIVE mg/dL
Nitrite: NEGATIVE
PROTEIN: 100 mg/dL — AB
Specific Gravity, Urine: 1.024 (ref 1.005–1.030)
pH: 8 (ref 5.0–8.0)

## 2016-10-15 LAB — COMPREHENSIVE METABOLIC PANEL
ALBUMIN: 3.3 g/dL — AB (ref 3.5–5.0)
ALK PHOS: 37 U/L — AB (ref 38–126)
ALT: 18 U/L (ref 14–54)
ANION GAP: 8 (ref 5–15)
AST: 25 U/L (ref 15–41)
BUN: 7 mg/dL (ref 6–20)
CALCIUM: 8.8 mg/dL — AB (ref 8.9–10.3)
CO2: 21 mmol/L — AB (ref 22–32)
CREATININE: 0.81 mg/dL (ref 0.44–1.00)
Chloride: 111 mmol/L (ref 101–111)
GFR calc Af Amer: >60 mL/min (ref >60)
GFR calc non Af Amer: >60 mL/min (ref >60)
GLUCOSE: 121 mg/dL — AB (ref 65–99)
Potassium: 4.3 mmol/L (ref 3.5–5.1)
SODIUM: 140 mmol/L (ref 135–145)
Total Bilirubin: <0.1 mg/dL — ABNORMAL LOW (ref 0.3–1.2)
Total Protein: 5.5 g/dL — ABNORMAL LOW (ref 6.5–8.1)

## 2016-10-15 LAB — WET PREP, GENITAL
CLUE CELLS WET PREP: NONE SEEN
Sperm: NONE SEEN
TRICH WET PREP: NONE SEEN
YEAST WET PREP: NONE SEEN

## 2016-10-15 LAB — LIPASE, BLOOD: Lipase: 16 U/L (ref 11–51)

## 2016-10-15 LAB — POC URINE PREG, ED: PREG TEST UR: NEGATIVE

## 2016-10-15 MED ORDER — IBUPROFEN 800 MG PO TABS: 800.0000 mg | ORAL_TABLET | Freq: Three times a day (TID) | ORAL | 0 refills | Status: DC | PRN

## 2016-10-15 MED ORDER — KETOROLAC TROMETHAMINE 15 MG/ML IJ SOLN
15.0000 mg | Freq: Once | INTRAMUSCULAR | Status: AC
  Administered 2016-10-15: 15 mg via INTRAMUSCULAR
  Filled 2016-10-15: qty 1

## 2016-10-15 MED ORDER — ONDANSETRON HCL 4 MG PO TABS: 4.0000 mg | ORAL_TABLET | Freq: Every day | ORAL | 0 refills | Status: DC | PRN

## 2016-10-15 NOTE — ED Provider Notes (Signed)
[facility]-EMERGENCY DEPT Provider Note   CSN: [id number] Arrival date & time: 10/15/16  1144  History   Chief Complaint Chief Complaint  Patient presents with  . Abdominal Pain    HPI Chevelle Coulson is a 21 y.o. female presenting with pelvic pain and cramping that began this morning. She is currently on her period. She states her pain was so bad she was unable to walk, felt nauseated and vomited once. She tried to take tylenol but was unable to keep it down. Has no appetite. She reports she has not been able to urinate or have a bowel movement today, denies dysuria prior to today. She currently denies pain, no current nausea. She does not want nausea medication at this point. She is sexually active with 1 female partner, occasionally uses condoms, does not use any method of hormonal contraception.   HPI  History reviewed. No pertinent past medical history.  There are no active problems to display for this patient.   History reviewed. No pertinent surgical history.  OB History    No data available       Home Medications    Prior to Admission medications   Medication Sig Start Date End Date Taking? Authorizing Provider  ibuprofen (ADVIL,MOTRIN) 800 MG tablet Take 1 tablet (800 mg total) by mouth every 8 (eight) hours as needed. 10/15/16   Tillman Sers, DO  ondansetron (ZOFRAN) 4 MG tablet Take 1 tablet (4 mg total) by mouth daily as needed for nausea or vomiting. 10/15/16 10/15/17  Tillman Sers, DO    Family History Family History  Problem Relation Age of Onset  . Hypertension Mother   . Hypertension Father   . Diabetes Maternal Grandmother   . Diabetes Maternal Grandfather     Social History Social History  Substance Use Topics  . Smoking status: Never Smoker  . Smokeless tobacco: Never Used  . Alcohol use No     Allergies   Protopic [tacrolimus]   Review of Systems Review of Systems  Constitutional: Positive for appetite change and fatigue. Negative for  activity change, chills and fever.  HENT: Negative for congestion and sore throat.   Eyes: Negative for visual disturbance.  Respiratory: Negative for cough and shortness of breath.   Cardiovascular: Negative for chest pain and palpitations.  Gastrointestinal: Positive for constipation, nausea and vomiting. Negative for abdominal distention, abdominal pain, blood in stool and diarrhea.  Genitourinary: Positive for decreased urine volume, difficulty urinating, pelvic pain and vaginal bleeding. Negative for dysuria, vaginal discharge and vaginal pain.  Musculoskeletal: Negative for back pain, myalgias and neck pain.  Skin: Negative for rash and wound.  Neurological: Negative for dizziness, seizures, weakness and headaches.  Psychiatric/Behavioral: Negative for confusion. The patient is not nervous/anxious.      Physical Exam Updated Vital Signs BP 130/75 (BP Location: Right Arm)   Pulse 70   Resp 18   LMP 10/12/2016   SpO2 100%   Physical Exam  Constitutional: She is oriented to person, place, and time. She appears well-developed and well-nourished. No distress.  HENT:  Head: Normocephalic and atraumatic.  Eyes: Pupils are equal, round, and reactive to light. Conjunctivae and EOM are normal.  Neck: Normal range of motion. Neck supple.  Cardiovascular: Normal rate and regular rhythm.   No murmur heard. Pulmonary/Chest: Effort normal and breath sounds normal. No respiratory distress.  Abdominal: Soft. Bowel sounds are normal. She exhibits no distension. There is no tenderness. There is no guarding.  Genitourinary: Cervix exhibits no  motion tenderness. Right adnexum displays no mass, no tenderness and no fullness. Left adnexum displays no mass, no tenderness and no fullness. There is bleeding in the vagina. No erythema or tenderness in the vagina. No foreign body in the vagina. No vaginal discharge found.  Musculoskeletal: Normal range of motion. She exhibits no edema or tenderness.    Neurological: She is alert and oriented to person, place, and time. She exhibits normal muscle tone.  Skin: Skin is warm and dry. No rash noted. No pallor.  Psychiatric: She has a normal mood and affect. Her behavior is normal. Judgment and thought content normal.     ED Treatments / Results  Labs (all labs ordered are listed, but only abnormal results are displayed) Labs Reviewed  WET PREP, GENITAL - Abnormal; Notable for the following:       Result Value   WBC, Wet Prep HPF POC FEW (*)    All other components within normal limits  COMPREHENSIVE METABOLIC PANEL - Abnormal; Notable for the following:    CO2 21 (*)    Glucose, Bld 121 (*)    Calcium 8.8 (*)    Total Protein 5.5 (*)    Albumin 3.3 (*)    Alkaline Phosphatase 37 (*)    Total Bilirubin <0.1 (*)    All other components within normal limits  URINALYSIS, ROUTINE W REFLEX MICROSCOPIC - Abnormal; Notable for the following:    APPearance HAZY (*)    Hgb urine dipstick MODERATE (*)    Protein, ur 100 (*)    Leukocytes, UA TRACE (*)    Bacteria, UA MANY (*)    Squamous Epithelial / LPF 0-5 (*)    All other components within normal limits  URINE CULTURE  LIPASE, BLOOD  CBC  POC URINE PREG, ED  GC/CHLAMYDIA PROBE AMP ([place]) NOT AT [facility]    EKG  EKG Interpretation None       Radiology No results found.  Procedures Procedures (including critical care time)  Medications Ordered in ED Medications  ketorolac (TORADOL) 15 MG/ML injection 15 mg (15 mg Intramuscular Given 10/15/16 1725)     Initial Impression / Assessment and Plan / ED Course  I have reviewed the triage vital signs and the nursing notes.  Pertinent labs & imaging results that were available during my care of the patient were reviewed by me and considered in my medical decision making (see chart for details).     Well appearing 21 year old female presenting with pelvic pain consistent with severe menstrual cramps. CBC and CMP normal,  negative urine pregnancy test. Urinalysis with blood, bacteria and leukocytes sent for urine culture. No urinary symptoms. Will await culture results. Pelvic exam normal with small amount of blood in vaginal canal. No CMT, no discharge. Negative wet prep. Gc/chlamydia sent to lab. Etiologies for pain discussed in detail with patient. Given information to follow up with [facility] clinic. Rx for zofran as needed for nausea and 800 mg ibuprofen to use TID for further cramping. Advised heating pad for relief as well. Stable for discharge home. Follow up with gyn. Patient verbalized understanding and agreement with plan.   Final Clinical Impressions(s) / ED Diagnoses   Final diagnoses:  Severe menstrual cramps    New Prescriptions New Prescriptions   IBUPROFEN (ADVIL,MOTRIN) 800 MG TABLET    Take 1 tablet (800 mg total) by mouth every 8 (eight) hours as needed.   ONDANSETRON (ZOFRAN) 4 MG TABLET    Take 1 tablet (  4 mg total) by mouth daily as needed for nausea or vomiting.     Tillman Sers, DO 10/15/16 1836    Clarene Duke Ambrose Finland, MD 10/17/16 1153

## 2016-10-15 NOTE — ED Notes (Signed)
Pt refused assessment from this RN

## 2016-10-15 NOTE — ED Triage Notes (Signed)
Patient c/o lower abdominal pain and pelvic cramping with "pressure" since beginning menstrual cycle x3 days ago. Patient also c/o N/V/D. Ambulatory.

## 2016-10-16 LAB — GC/CHLAMYDIA PROBE AMP (~~LOC~~) NOT AT ARMC
Chlamydia: NEGATIVE
Neisseria Gonorrhea: NEGATIVE

## 2016-10-17 LAB — URINE CULTURE

## 2016-11-23 ENCOUNTER — Encounter (HOSPITAL_COMMUNITY): Payer: Self-pay | Admitting: *Deleted

## 2016-11-23 ENCOUNTER — Inpatient Hospital Stay (HOSPITAL_COMMUNITY)
Admission: AD | Admit: 2016-11-23 | Discharge: 2016-11-23 | Disposition: A | Discharge status: Home / Self Care | Payer: BLUE CROSS/BLUE SHIELD | Source: Ambulatory Visit | Attending: Obstetrics & Gynecology | Admitting: Obstetrics & Gynecology

## 2016-11-23 DIAGNOSIS — R112 Nausea with vomiting, unspecified: Secondary | ICD-10-CM | POA: Insufficient documentation

## 2016-11-23 DIAGNOSIS — O26891 Other specified pregnancy related conditions, first trimester: Secondary | ICD-10-CM | POA: Insufficient documentation

## 2016-11-23 DIAGNOSIS — R197 Diarrhea, unspecified: Secondary | ICD-10-CM | POA: Diagnosis not present

## 2016-11-23 DIAGNOSIS — O219 Vomiting of pregnancy, unspecified: Secondary | ICD-10-CM

## 2016-11-23 DIAGNOSIS — N898 Other specified noninflammatory disorders of vagina: Secondary | ICD-10-CM | POA: Diagnosis not present

## 2016-11-23 DIAGNOSIS — Z3A01 Less than 8 weeks gestation of pregnancy: Secondary | ICD-10-CM | POA: Diagnosis not present

## 2016-11-23 DIAGNOSIS — Z3201 Encounter for pregnancy test, result positive: Secondary | ICD-10-CM | POA: Diagnosis not present

## 2016-11-23 LAB — URINALYSIS, ROUTINE W REFLEX MICROSCOPIC
Bilirubin Urine: NEGATIVE
Glucose, UA: NEGATIVE mg/dL
Hgb urine dipstick: NEGATIVE
KETONES UR: 20 mg/dL — AB
Nitrite: NEGATIVE
PROTEIN: 30 mg/dL — AB
Specific Gravity, Urine: 1.028 (ref 1.005–1.030)
pH: 6 (ref 5.0–8.0)

## 2016-11-23 LAB — WET PREP, GENITAL
CLUE CELLS WET PREP: NONE SEEN
Sperm: NONE SEEN
TRICH WET PREP: NONE SEEN
Yeast Wet Prep HPF POC: NONE SEEN

## 2016-11-23 LAB — POCT PREGNANCY, URINE: PREG TEST UR: POSITIVE — AB

## 2016-11-23 MED ORDER — DOXYLAMINE-PYRIDOXINE 10-10 MG PO TBEC: 2.0000 | DELAYED_RELEASE_TABLET | Freq: Every day | ORAL | 3 refills | Status: DC

## 2016-11-23 MED ORDER — DOXYLAMINE SUCCINATE (SLEEP) 25 MG PO TABS
25.0000 mg | ORAL_TABLET | Freq: Once | ORAL | Status: AC
  Administered 2016-11-23: 25 mg via ORAL
  Filled 2016-11-23: qty 1

## 2016-11-23 MED ORDER — PRENATAL VITAMIN 27-0.8 MG PO TABS: 1.0000 | ORAL_TABLET | Freq: Every day | ORAL | 11 refills | Status: DC

## 2016-11-23 MED ORDER — PYRIDOXINE HCL 25 MG PO TABS
25.0000 mg | ORAL_TABLET | Freq: Once | ORAL | Status: AC
Start: 2016-11-23 — End: 2016-11-23
  Administered 2016-11-23: 25 mg via ORAL
  Filled 2016-11-23: qty 1

## 2016-11-23 NOTE — MAU Note (Signed)
Pt presents to MAU with complaints of N/V/D for a couple of days. LMP 10/12/16

## 2016-11-23 NOTE — Discharge Instructions (Signed)
First Trimester of Pregnancy The first trimester of pregnancy is from week 1 until the end of week 13 (months 1 through 3). During this time, your baby will begin to develop inside you. At 6-8 weeks, the eyes and face are formed, and the heartbeat can be seen on ultrasound. At the end of 12 weeks, all the baby's organs are formed. Prenatal care is all the medical care you receive before the birth of your baby. Make sure you get good prenatal care and follow all of your doctor's instructions. Follow these instructions at home: Medicines  Take over-the-counter and prescription medicines only as told by your doctor. Some medicines are safe and some medicines are not safe during pregnancy.  Take a prenatal vitamin that contains at least 600 micrograms (mcg) of folic acid.  If you have trouble pooping (constipation), take medicine that will make your stool soft (stool softener) if your doctor approves. Eating and drinking  Eat regular, healthy meals.  Your doctor will tell you the amount of weight gain that is right for you.  Avoid raw meat and uncooked cheese.  If you feel sick to your stomach (nauseous) or throw up (vomit): ? Eat 4 or 5 small meals a day instead of 3 large meals. ? Try eating a few soda crackers. ? Drink liquids between meals instead of during meals.  To prevent constipation: ? Eat foods that are high in fiber, like fresh fruits and vegetables, whole grains, and beans. ? Drink enough fluids to keep your pee (urine) clear or pale yellow. Activity  Exercise only as told by your doctor. Stop exercising if you have cramps or pain in your lower belly (abdomen) or low back.  Do not exercise if it is too hot, too humid, or if you are in a place of great height (high altitude).  Try to avoid standing for long periods of time. Move your legs often if you must stand in one place for a long time.  Avoid heavy lifting.  Wear low-heeled shoes. Sit and stand up straight.  You  can have sex unless your doctor tells you not to. Relieving pain and discomfort  Wear a good support bra if your breasts are sore.  Take warm water baths (sitz baths) to soothe pain or discomfort caused by hemorrhoids. Use hemorrhoid cream if your doctor says it is okay.  Rest with your legs raised if you have leg cramps or low back pain.  If you have puffy, bulging veins (varicose veins) in your legs: ? Wear support hose or compression stockings as told by your doctor. ? Raise (elevate) your feet for 15 minutes, 3-4 times a day. ? Limit salt in your food. Prenatal care  Schedule your prenatal visits by the twelfth week of pregnancy.  Write down your questions. Take them to your prenatal visits.  Keep all your prenatal visits as told by your doctor. This is important. Safety  Wear your seat belt at all times when driving.  Make a list of emergency phone numbers. The list should include numbers for family, friends, the hospital, and police and fire departments. General instructions  Ask your doctor for a referral to a local prenatal class. Begin classes no later than at the start of month 6 of your pregnancy.  Ask for help if you need counseling or if you need help with nutrition. Your doctor can give you advice or tell you where to go for help.  Do not use hot tubs, steam rooms, or   saunas.  Do not douche or use tampons or scented sanitary pads.  Do not cross your legs for long periods of time.  Avoid all herbs and alcohol. Avoid drugs that are not approved by your doctor.  Do not use any tobacco products, including cigarettes, chewing tobacco, and electronic cigarettes. If you need help quitting, ask your doctor. You may get counseling or other support to help you quit.  Avoid cat litter boxes and soil used by cats. These carry germs that can cause birth defects in the baby and can cause a loss of your baby (miscarriage) or stillbirth.  Visit your dentist. At home, brush  your teeth with a soft toothbrush. Be gentle when you floss. Contact a doctor if:  You are dizzy.  You have mild cramps or pressure in your lower belly.  You have a nagging pain in your belly area.  You continue to feel sick to your stomach, you throw up, or you have watery poop (diarrhea).  You have a bad smelling fluid coming from your vagina.  You have pain when you pee (urinate).  You have increased puffiness (swelling) in your face, hands, legs, or ankles. Get help right away if:  You have a fever.  You are leaking fluid from your vagina.  You have spotting or bleeding from your vagina.  You have very bad belly cramping or pain.  You gain or lose weight rapidly.  You throw up blood. It may look like coffee grounds.  You are around people who have German measles, fifth disease, or chickenpox.  You have a very bad headache.  You have shortness of breath.  You have any kind of trauma, such as from a fall or a car accident. Summary  The first trimester of pregnancy is from week 1 until the end of week 13 (months 1 through 3).  To take care of yourself and your unborn baby, you will need to eat healthy meals, take medicines only if your doctor tells you to do so, and do activities that are safe for you and your baby.  Keep all follow-up visits as told by your doctor. This is important as your doctor will have to ensure that your baby is healthy and growing well. This information is not intended to replace advice given to you by your health care provider. Make sure you discuss any questions you have with your health care provider. Document Released: 06/24/2007 Document Revised: 01/14/2016 Document Reviewed: 01/14/2016 Elsevier Interactive Patient Education  2017 [employer]  

## 2016-11-23 NOTE — MAU Provider Note (Signed)
Chief Complaint: Nausea; Diarrhea; and Emesis   SUBJECTIVE HPI: Tammy Cruz is a 21 y.o. G1P0 at [redacted]w[redacted]d who presents to MAU complaining of nausea and decreased appetite  For the last few weeks. This sis not normal for her. She also has some associated diarrhea. She could be pregnant but has not taken any HPT. Patient also endorsing increased vaginal discharge that is clear and sticky. No associated odor or irritation to the discharge. She denies abdominal pain, blood, fevers. Having some lightheadedness.   No past medical history on file. OB History  Gravida Para Term Preterm AB Living  1            SAB TAB Ectopic Multiple Live Births               # Outcome Date GA Lbr Len/2nd Weight Sex Delivery Anes PTL Lv  1 Current              No past surgical history on file. Social History   Socioeconomic History  . Marital status: Single    Spouse name: n/a  . Number of children: 0  . Years of education: Not on file  . Highest education level: Not on file  Social Needs  . Financial resource strain: Not on file  . Food insecurity - worry: Not on file  . Food insecurity - inability: Not on file  . Transportation needs - medical: Not on file  . Transportation needs - non-medical: Not on file  Occupational History  . Occupation: Consulting civil engineer    Comment: Electrical engineer  Tobacco Use  . Smoking status: Never Smoker  . Smokeless tobacco: Never Used  Substance and Sexual Activity  . Alcohol use: No    Alcohol/week: 0.0 oz  . Drug use: No  . Sexual activity: Yes    Partners: Male    Birth control/protection: Condom, None  Other Topics Concern  . Not on file  Social History Narrative   Lives with her mother and sister.   Her brother lives with their father in [place].   No current facility-administered medications on file prior to encounter.    Current Outpatient Medications on File Prior to Encounter  Medication Sig Dispense Refill  . ibuprofen (ADVIL,MOTRIN)  800 MG tablet Take 1 tablet (800 mg total) by mouth every 8 (eight) hours as needed. 30 tablet 0  . ondansetron (ZOFRAN) 4 MG tablet Take 1 tablet (4 mg total) by mouth daily as needed for nausea or vomiting. 30 tablet 0   Allergies  Allergen Reactions  . Protopic [Tacrolimus] Rash    I have reviewed the past Medical Hx, Surgical Hx, Social Hx, Allergies and Medications.   REVIEW OF SYSTEMS All systems reviewed and are negative for acute change except as noted in the HPI.   OBJECTIVE BP 127/67   Pulse 88   Temp 97.8 F (36.6 C)   Resp 16   Ht 5\' 3"  (1.6 m)   Wt 68 kg (150 lb)   LMP 10/12/2016   BMI 26.57 kg/m    PHYSICAL EXAM Constitutional: Well-developed, well-nourished female in no acute distress.  Cardiovascular: normal rate and rhythm, pulses intact Respiratory: normal rate and effort.  GI: Abd soft, non-tender, non-distended. MS: Extremities nontender, no edema, normal ROM Neurologic: Alert and oriented x 4. No focal deficits SPECULUM EXAM: NEFG, physiologic discharge, no blood noted, cervix clean BIMANUAL: uterus normal size, no adnexal tenderness or masses. No CMT. Psych: normal mood and affect  LAB RESULTS Results  for orders placed or performed during the hospital encounter of 11/23/16 (from the past 24 hour(s))  Urinalysis, Routine w reflex microscopic     Status: Abnormal   Collection Time: 11/23/16  1:45 PM  Result Value Ref Range   Color, Urine YELLOW YELLOW   APPearance HAZY (A) CLEAR   Specific Gravity, Urine 1.028 1.005 - 1.030   pH 6.0 5.0 - 8.0   Glucose, UA NEGATIVE NEGATIVE mg/dL   Hgb urine dipstick NEGATIVE NEGATIVE   Bilirubin Urine NEGATIVE NEGATIVE   Ketones, ur 20 (A) NEGATIVE mg/dL   Protein, ur 30 (A) NEGATIVE mg/dL   Nitrite NEGATIVE NEGATIVE   Leukocytes, UA TRACE (A) NEGATIVE   RBC / HPF 0-5 0 - 5 RBC/hpf   WBC, UA 0-5 0 - 5 WBC/hpf   Bacteria, UA RARE (A) NONE SEEN   Squamous Epithelial / LPF 6-30 (A) NONE SEEN   Mucus PRESENT    Pregnancy, urine POC     Status: Abnormal   Collection Time: 11/23/16  2:32 PM  Result Value Ref Range   Preg Test, Ur POSITIVE (A) NEGATIVE  Wet prep, genital     Status: Abnormal   Collection Time: 11/23/16  3:50 PM  Result Value Ref Range   Yeast Wet Prep HPF POC NONE SEEN NONE SEEN   Trich, Wet Prep NONE SEEN NONE SEEN   Clue Cells Wet Prep HPF POC NONE SEEN NONE SEEN   WBC, Wet Prep HPF POC MODERATE (A) NONE SEEN   Sperm NONE SEEN     IMAGING No results found.  MAU COURSE Vitals and nursing notes reviewed I have ordered labs and reviewed them Upreg positive Urine sent for culture Cultures sent and pending Wet prep insignificant Treatments given in MAU: nausea medication  MDM Plan of care reviewed with patient, including labs and tests ordered and medical treatment.   ASSESSMENT 1. Positive pregnancy test   2. Vaginal discharge     PLAN Discharge home in stable condition List of OB providers given; encouraged to initiate prenatal care Safe medications in pregnancy given Rx for diclegis and prenatal vitamin Counseled on return precautions Handout given   Caryl AdaJazma Mendy Lapinsky, DO OB Fellow Faculty Practice, [facility] [place] 11/23/2016, 4:16 PM

## 2016-11-24 LAB — CULTURE, OB URINE

## 2016-11-24 LAB — GC/CHLAMYDIA PROBE AMP (~~LOC~~) NOT AT ARMC
CHLAMYDIA, DNA PROBE: NEGATIVE
NEISSERIA GONORRHEA: NEGATIVE

## 2016-12-18 LAB — OB RESULTS CONSOLE ABO/RH: RH Type: POSITIVE

## 2016-12-18 LAB — OB RESULTS CONSOLE GC/CHLAMYDIA
Chlamydia: NEGATIVE
GC PROBE AMP, GENITAL: NEGATIVE

## 2016-12-18 LAB — OB RESULTS CONSOLE RPR: RPR: NONREACTIVE

## 2016-12-18 LAB — OB RESULTS CONSOLE HEPATITIS B SURFACE ANTIGEN: HEP B S AG: NEGATIVE

## 2016-12-18 LAB — OB RESULTS CONSOLE RUBELLA ANTIBODY, IGM: RUBELLA: IMMUNE

## 2016-12-18 LAB — OB RESULTS CONSOLE ANTIBODY SCREEN: ANTIBODY SCREEN: NEGATIVE

## 2016-12-18 LAB — OB RESULTS CONSOLE HIV ANTIBODY (ROUTINE TESTING): HIV: NONREACTIVE

## 2017-04-01 DIAGNOSIS — L2084 Intrinsic (allergic) eczema: Secondary | ICD-10-CM | POA: Diagnosis not present

## 2017-04-27 ENCOUNTER — Other Ambulatory Visit (HOSPITAL_COMMUNITY): Payer: Self-pay | Admitting: Obstetrics & Gynecology

## 2017-04-27 DIAGNOSIS — O26849 Uterine size-date discrepancy, unspecified trimester: Secondary | ICD-10-CM

## 2017-05-10 ENCOUNTER — Ambulatory Visit (HOSPITAL_COMMUNITY)
Admission: RE | Admit: 2017-05-10 | Discharge: 2017-05-10 | Disposition: A | Discharge status: Home / Self Care | Payer: BLUE CROSS/BLUE SHIELD | Source: Ambulatory Visit | Attending: Obstetrics & Gynecology | Admitting: Obstetrics & Gynecology

## 2017-05-10 ENCOUNTER — Other Ambulatory Visit (HOSPITAL_COMMUNITY): Payer: Self-pay | Admitting: Obstetrics & Gynecology

## 2017-05-10 DIAGNOSIS — Z3689 Encounter for other specified antenatal screening: Secondary | ICD-10-CM

## 2017-05-10 DIAGNOSIS — O26893 Other specified pregnancy related conditions, third trimester: Secondary | ICD-10-CM | POA: Diagnosis not present

## 2017-05-10 DIAGNOSIS — Z3A30 30 weeks gestation of pregnancy: Secondary | ICD-10-CM | POA: Diagnosis not present

## 2017-05-10 DIAGNOSIS — O26849 Uterine size-date discrepancy, unspecified trimester: Secondary | ICD-10-CM

## 2017-06-09 ENCOUNTER — Encounter (HOSPITAL_COMMUNITY): Payer: Self-pay | Admitting: *Deleted

## 2017-06-09 ENCOUNTER — Inpatient Hospital Stay (HOSPITAL_COMMUNITY)
Admission: AD | Admit: 2017-06-09 | Discharge: 2017-06-09 | Disposition: A | Discharge status: Home / Self Care | Payer: BLUE CROSS/BLUE SHIELD | Source: Ambulatory Visit | Attending: Obstetrics and Gynecology | Admitting: Obstetrics and Gynecology

## 2017-06-09 DIAGNOSIS — O368130 Decreased fetal movements, third trimester, not applicable or unspecified: Secondary | ICD-10-CM | POA: Diagnosis not present

## 2017-06-09 DIAGNOSIS — O9A313 Physical abuse complicating pregnancy, third trimester: Secondary | ICD-10-CM

## 2017-06-09 DIAGNOSIS — O26893 Other specified pregnancy related conditions, third trimester: Secondary | ICD-10-CM | POA: Insufficient documentation

## 2017-06-09 DIAGNOSIS — S80212A Abrasion, left knee, initial encounter: Secondary | ICD-10-CM | POA: Insufficient documentation

## 2017-06-09 DIAGNOSIS — Z3A34 34 weeks gestation of pregnancy: Secondary | ICD-10-CM | POA: Diagnosis not present

## 2017-06-09 HISTORY — DX: Anemia, unspecified: D64.9

## 2017-06-09 LAB — URINALYSIS, ROUTINE W REFLEX MICROSCOPIC
Bilirubin Urine: NEGATIVE
Glucose, UA: NEGATIVE mg/dL
KETONES UR: NEGATIVE mg/dL
NITRITE: NEGATIVE
PH: 7 (ref 5.0–8.0)
Protein, ur: 100 mg/dL — AB
Specific Gravity, Urine: 1.014 (ref 1.005–1.030)
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
## 2017-06-09 NOTE — Discharge Instructions (Signed)
Domestic Violence Information  What is domestic violence?  Domestic violence, also called intimate partner violence, can involve physical, emotional, psychological, sexual, and economic abuse by a current or former intimate partner. Stalking is also considered a type of domestic violence. Domestic violence can happen between people who are or were:  · Married.  · Dating.  · Living together.    Abusers repeatedly act to maintain control and power over their partner.  Physical abuse  Physical abuse can include:  · Slapping.  · Hitting.  · Kicking.  · Punching.  · Choking.  · Pulling the victim’s hair.  · Damaging the victim’s property.  · Threatening or hurting the victim with weapons.  · Trapping the victim in his or her home.  · Forcing the victim to use drugs or alcohol.    Emotional and psychological abuse  Emotional and psychological abuse can include:  · Threats.  · Insults.  · Isolation.  · Humiliation.  · Jealousy and possessiveness.  · Blame.  · Withholding affection.  · Intimidation.  · Manipulation.  · Limiting contact with friends and family.    Sexual abuse  Sexual abuse can include:  · Forcing sex.  · Forcing sexual touching.  · Hurting the victim during sex.  · Forcing the victim to have sex with other people.  · Giving the victim a sexually transmitted disease (STD) on purpose.    Economic abuse  Economic abuse can include:  · Controlling resources, such as money, food, transportation, a phone, or computer.  · Stealing money from the victim or his or her family or friends.  · Forbidding the victim to work.  · Refusing to work or to contribute to the household.    Stalking  Stalking can include:  · Making repeated, unwanted phone calls, emails, or text messages.  · Leaving cards, letters, flowers or other items the victim does not want.  · Watching or following the victim from a distance.  · Going to places where the victim does not want the abuser.  · Entering the victim’s home or car.  · Damaging the  victim’s personal property.    What are some warning signs of domestic violence?  Physical signs  · Bruises.  · Broken bones.  · Burns or cuts.  · Physical pain.  · Head injury.  Emotional and psychological signs  · Crying.  · Depression.  · Hopelessness.  · Desperation.  · Trouble sleeping.  · Fear of partner.  · Anxiety.  · Suicidal behavior.  · Antisocial behavior.  · Low self-esteem.  · Fear of intimacy.  · Flashbacks.  Sexual signs  · Bruising, swelling, or bleeding of the genital or rectal area.  · Signs of a sexually transmitted infection, such as genital sores, warts, or discharge coming from the genital area.  · Pain in the genital area.  · Unintended pregnancy.  · Problems with pregnancy, including delayed prenatal care and prematurity.  Economic signs  · Having little money or food.  · Homelessness.  · Asking for or borrowing money.  What are common behaviors of those affected by domestic violence?  Those affected by domestic violence may:  · Be late to work or other events.  · Not show up to places as promised.  · Have to let their partner know where they are and who they are with.  · Be isolated or kept from seeing friends or family.  · Make comments about their partner's temper or behavior.  ·   Make excuses for their partner.  · Engage in high-risk sexual behaviors.  · Use drugs or alcohol.  · Have unhealthy diet-related behaviors.    What are common feelings of those affected by domestic violence?  Those affected by domestic violence may feel that:  · They have to be careful not to say or do things that trigger their partner's anger.  · They cannot do anything right.  · They deserve to be treated badly.  · They are overreacting to their partner’s behavior or temper.  · They cannot trust their own feelings.  · They cannot trust other people.  · They are trapped.  · Their partner would take away their children.  · They are emotionally drained or numb.  · Their life is in danger.  · They might have to  kill their partner to survive.    Where can you get help?  Domestic violence hotlines and websites  If you do not feel safe searching for help online at home, use a computer at a public library to access the Internet. Call 911 if you are in immediate danger or need medical help.  · The National Domestic Violence Hotline.  ? The 24-hour phone hotline is 1-800-799-7233 or 1-800-787-3224 (TTY).  ? The videophone is available Monday through Friday, 9 a.m. to 5 p.m. Call 1-855-812-1001.  ? The website is http://thehotline.org  · The National Sexual Assault Hotline.  ? The 24-hour phone hotline is 1-800-656-4673.  ? You can access the online hotline at https://ohl.rainn.org/online/    Shelters for victims of domestic violence  If you are a victim of domestic violence, there are resources to help you find a temporary place for you and your children to live (shelter). The specific address of these shelters is often not known to the public.  Police  Report assaults, threats, and stalking to the police.  Counselors and counseling centers  People who have been victims of domestic violence can benefit from counseling. Counseling can help you cope with difficult emotions and empower you to plan for your future safety. The topics you discuss with a counselor are private and confidential. Children of domestic violence victims also might need counseling to manage stress and anxiety.  The court system  You can work with a lawyer or an advocate to get legal protection against an abuser. Protection includes restraining orders and private addresses. Crimes against you, such as assault, can also be prosecuted through the courts. Laws vary by state.  This information is not intended to replace advice given to you by your health care provider. Make sure you discuss any questions you have with your health care provider.  Document Released: 03/28/2003 Document Revised: 12/20/2015 Document Reviewed: 09/22/2013  Elsevier Interactive Patient  Education © 2018 [employer]
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
## 2017-06-17 LAB — OB RESULTS CONSOLE GBS: GBS: POSITIVE

## 2017-06-29 ENCOUNTER — Encounter (HOSPITAL_COMMUNITY): Payer: Self-pay | Admitting: *Deleted

## 2017-06-29 ENCOUNTER — Inpatient Hospital Stay (HOSPITAL_COMMUNITY)
Admission: AD | Admit: 2017-06-29 | Discharge: 2017-06-29 | Disposition: A | Discharge status: Home / Self Care | Payer: BLUE CROSS/BLUE SHIELD | Source: Ambulatory Visit | Attending: Obstetrics and Gynecology | Admitting: Obstetrics and Gynecology

## 2017-06-29 DIAGNOSIS — O368130 Decreased fetal movements, third trimester, not applicable or unspecified: Secondary | ICD-10-CM | POA: Diagnosis present

## 2017-06-29 DIAGNOSIS — Z3A37 37 weeks gestation of pregnancy: Secondary | ICD-10-CM | POA: Insufficient documentation

## 2017-06-29 DIAGNOSIS — Z3493 Encounter for supervision of normal pregnancy, unspecified, third trimester: Secondary | ICD-10-CM

## 2017-06-29 NOTE — MAU Provider Note (Signed)
History     CSN: [id number]  Arrival date and time: 06/29/17 2115   None     Chief Complaint  Patient presents with  . Decreased Fetal Movement   Patient reports she was very busy today and did not feel as much fetal movement as usual. She felt some movements but felt they were less intense and less frequent than normal. She ate a snack and hydrated and fetal movement did not increase so she presented to MAU. She did not do formal fetal movement counts. She has felt movement while in MAU. Patient indicated decreased fetal movement at her 35 week office visit as well and had reactive NST at that time.    OB History    Gravida  2   Para      Term      Preterm      AB  1   Living        SAB  1   TAB      Ectopic      Multiple      Live Births              Past Medical History:  Diagnosis Date  . Anemia     Past Surgical History:  Procedure Laterality Date  . incision & drainage chin      Family History  Problem Relation Age of Onset  . Hypertension Mother   . Hypertension Father   . Diabetes Maternal Grandmother   . Diabetes Maternal Grandfather     Social History   Tobacco Use  . Smoking status: Never Smoker  . Smokeless tobacco: Never Used  Substance Use Topics  . Alcohol use: No    Alcohol/week: 0.0 oz  . Drug use: No    Allergies:  Allergies  Allergen Reactions  . Protopic [Tacrolimus] Rash    Medications Prior to Admission  Medication Sig Dispense Refill Last Dose  . Prenatal Vit-Fe Fumarate-FA (PRENATAL VITAMIN) 27-0.8 MG TABS Take 1 tablet daily by mouth. 30 tablet 11 06/29/2017 at Unknown time  . triamcinolone (KENALOG) 0.025 % ointment triamcinolone acetonide 0.025 % topical ointment uses daily as needed for dry skin.   06/28/2017 at Unknown time  . Doxylamine-Pyridoxine 10-10 MG TBEC Take 2 tablets at bedtime by mouth. Start with 2 tablets every evening. If symptoms persist, add 1 tablet every morning. (Patient not taking:  Reported on 06/09/2017) 60 tablet 3 Not Taking at Unknown time    Review of Systems  All other systems reviewed and are negative.  Physical Exam   Vitals:   06/29/17 2125 06/29/17 2132 06/29/17 2230  BP:  117/67 123/69  Pulse:  (!) 112 98  Resp:  16   Temp:  98.2 F (36.8 C)   Weight: 81.9 kg (180 lb 8 oz)    Height: 5\' 3"  (1.6 m)      Physical Exam  Nursing note and vitals reviewed. Constitutional: She is oriented to person, place, and time. She appears well-developed and well-nourished.  HENT:  Head: Normocephalic.  Eyes: Pupils are equal, round, and reactive to light.  Cardiovascular: Normal rate, regular rhythm and normal heart sounds.  Respiratory: Effort normal and breath sounds normal.  Musculoskeletal: Normal range of motion.  Neurological: She is alert and oriented to person, place, and time.  Skin: Skin is warm and dry.  Psychiatric: She has a normal mood and affect. Her behavior is normal. Judgment and thought content normal.    MAU Course  Procedures NST  MDM Patient reported reduced but not absent movement today and indicated she was busy and had not been focused on fetal movement until she came home to rest. She did not do formal fetal kick counts. She had BPP in office yesterday which was 8/8.  Reactive NST in MAU today, baseline 150s with repeated accels in response to fetal movement, category 1 strip. Reassuring fetal status, will d/c home. Discussed physiologic reasons for smaller fetal movements at term and gave education on fetal movement counts.   Assessment and Plan  22 y.o. G2P0 at 37 weeks Reactive NST/Category 1 FHTs Fetal kick count education discussed and printed for patient Follow up in [facility] office on Friday Patient to present to office or present to MAU if she experiences decreased or absent fetal movement   Janeece Riggers 06/29/2017, 10:39 PM

## 2017-06-29 NOTE — Discharge Instructions (Signed)

## 2017-06-29 NOTE — MAU Note (Signed)
Pt reports decreased fetal movement today. States she tried eating and drinking something but has only felt small movements today. States she has not felt anything since 5:30pm. Fetal movement heard in triage. Pt denies vaginal bleeding or LOF or pain.

## 2017-06-30 ENCOUNTER — Other Ambulatory Visit: Payer: Self-pay | Admitting: Obstetrics and Gynecology

## 2017-06-30 ENCOUNTER — Telehealth (HOSPITAL_COMMUNITY): Payer: Self-pay | Admitting: *Deleted

## 2017-06-30 ENCOUNTER — Encounter (HOSPITAL_COMMUNITY): Payer: Self-pay | Admitting: *Deleted

## 2017-06-30 NOTE — Telephone Encounter (Signed)
Preadmission screen  

## 2017-07-14 ENCOUNTER — Other Ambulatory Visit: Payer: Self-pay

## 2017-07-14 ENCOUNTER — Inpatient Hospital Stay (HOSPITAL_COMMUNITY): Payer: Medicaid Other | Admitting: Anesthesiology

## 2017-07-14 ENCOUNTER — Inpatient Hospital Stay (HOSPITAL_COMMUNITY)
Admission: RE | Admit: 2017-07-14 | Discharge: 2017-07-18 | DRG: 788 | Disposition: A | Discharge status: Home / Self Care | Payer: Medicaid Other | Attending: Obstetrics & Gynecology | Admitting: Obstetrics & Gynecology

## 2017-07-14 ENCOUNTER — Encounter (HOSPITAL_COMMUNITY): Payer: Self-pay

## 2017-07-14 DIAGNOSIS — Z3A39 39 weeks gestation of pregnancy: Secondary | ICD-10-CM

## 2017-07-14 DIAGNOSIS — O9902 Anemia complicating childbirth: Secondary | ICD-10-CM | POA: Diagnosis present

## 2017-07-14 DIAGNOSIS — D649 Anemia, unspecified: Secondary | ICD-10-CM | POA: Diagnosis present

## 2017-07-14 DIAGNOSIS — O365930 Maternal care for other known or suspected poor fetal growth, third trimester, not applicable or unspecified: Principal | ICD-10-CM | POA: Diagnosis present

## 2017-07-14 DIAGNOSIS — Z3490 Encounter for supervision of normal pregnancy, unspecified, unspecified trimester: Secondary | ICD-10-CM | POA: Diagnosis present

## 2017-07-14 LAB — CBC
HCT: 34.8 % — ABNORMAL LOW (ref 36.0–46.0)
HEMOGLOBIN: 11.8 g/dL — AB (ref 12.0–15.0)
MCH: 27.3 pg (ref 26.0–34.0)
MCHC: 33.9 g/dL (ref 30.0–36.0)
MCV: 80.6 fL (ref 78.0–100.0)
Platelets: 242 10*3/uL (ref 150–400)
RBC: 4.32 MIL/uL (ref 3.87–5.11)
RDW: 15.2 % (ref 11.5–15.5)
WBC: 7.9 10*3/uL (ref 4.0–10.5)

## 2017-07-14 LAB — TYPE AND SCREEN
ABO/RH(D): O POS
Antibody Screen: NEGATIVE

## 2017-07-14 LAB — RPR: RPR: NONREACTIVE

## 2017-07-14 LAB — ABO/RH: ABO/RH(D): O POS

## 2017-07-14 MED ORDER — DIPHENHYDRAMINE HCL 50 MG/ML IJ SOLN
12.5000 mg | INTRAMUSCULAR | Status: DC | PRN
  Administered 2017-07-15: 12.5 mg via INTRAVENOUS
  Filled 2017-07-14: qty 1

## 2017-07-14 MED ORDER — OXYTOCIN BOLUS FROM INFUSION: 500.0000 mL | Freq: Once | INTRAVENOUS | Status: DC

## 2017-07-14 MED ORDER — EPHEDRINE 5 MG/ML INJ: 10.0000 mg | INTRAVENOUS | Status: DC | PRN

## 2017-07-14 MED ORDER — FENTANYL 2.5 MCG/ML BUPIVACAINE 1/10 % EPIDURAL INFUSION (WH - ANES)
14.0000 mL/h | INTRAMUSCULAR | Status: DC | PRN
  Administered 2017-07-14: 14 mL/h via EPIDURAL
  Filled 2017-07-14: qty 100

## 2017-07-14 MED ORDER — SOD CITRATE-CITRIC ACID 500-334 MG/5ML PO SOLN
30.0000 mL | ORAL | Status: DC | PRN
  Administered 2017-07-15: 30 mL via ORAL
  Filled 2017-07-14: qty 15

## 2017-07-14 MED ORDER — OXYCODONE-ACETAMINOPHEN 5-325 MG PO TABS: 2.0000 | ORAL_TABLET | ORAL | Status: DC | PRN

## 2017-07-14 MED ORDER — PHENYLEPHRINE 40 MCG/ML (10ML) SYRINGE FOR IV PUSH (FOR BLOOD PRESSURE SUPPORT)
80.0000 ug | PREFILLED_SYRINGE | INTRAVENOUS | Status: DC | PRN
  Filled 2017-07-14: qty 10

## 2017-07-14 MED ORDER — LIDOCAINE HCL (PF) 1 % IJ SOLN: 30.0000 mL | INTRAMUSCULAR | Status: DC | PRN

## 2017-07-14 MED ORDER — LACTATED RINGERS IV SOLN
500.0000 mL | Freq: Once | INTRAVENOUS | Status: AC
  Administered 2017-07-14: 500 mL via INTRAVENOUS

## 2017-07-14 MED ORDER — OXYCODONE-ACETAMINOPHEN 5-325 MG PO TABS: 1.0000 | ORAL_TABLET | ORAL | Status: DC | PRN

## 2017-07-14 MED ORDER — MISOPROSTOL 25 MCG QUARTER TABLET
25.0000 ug | ORAL_TABLET | ORAL | Status: DC
  Administered 2017-07-14 (×2): 25 ug via VAGINAL
  Filled 2017-07-14 (×4): qty 1

## 2017-07-14 MED ORDER — LACTATED RINGERS IV SOLN
500.0000 mL | INTRAVENOUS | Status: DC | PRN
  Administered 2017-07-14: 1000 mL via INTRAVENOUS
  Administered 2017-07-15: 500 mL via INTRAVENOUS

## 2017-07-14 MED ORDER — ACETAMINOPHEN 325 MG PO TABS: 650.0000 mg | ORAL_TABLET | ORAL | Status: DC | PRN

## 2017-07-14 MED ORDER — PHENYLEPHRINE 40 MCG/ML (10ML) SYRINGE FOR IV PUSH (FOR BLOOD PRESSURE SUPPORT): 80.0000 ug | PREFILLED_SYRINGE | INTRAVENOUS | Status: DC | PRN

## 2017-07-14 MED ORDER — SODIUM CHLORIDE 0.9 % IV SOLN
5.0000 10*6.[IU] | Freq: Once | INTRAVENOUS | Status: AC
  Administered 2017-07-14: 5 10*6.[IU] via INTRAVENOUS
  Filled 2017-07-14: qty 5

## 2017-07-14 MED ORDER — ONDANSETRON HCL 4 MG/2ML IJ SOLN: 4.0000 mg | Freq: Four times a day (QID) | INTRAMUSCULAR | Status: DC | PRN

## 2017-07-14 MED ORDER — FENTANYL 2.5 MCG/ML BUPIVACAINE 1/10 % EPIDURAL INFUSION (WH - ANES): 14.0000 mL/h | INTRAMUSCULAR | Status: DC | PRN

## 2017-07-14 MED ORDER — PENICILLIN G POT IN DEXTROSE 60000 UNIT/ML IV SOLN
3.0000 10*6.[IU] | INTRAVENOUS | Status: DC
  Administered 2017-07-14 (×5): 3 10*6.[IU] via INTRAVENOUS
  Filled 2017-07-14 (×9): qty 50

## 2017-07-14 MED ORDER — OXYTOCIN 40 UNITS IN LACTATED RINGERS INFUSION - SIMPLE MED
1.0000 m[IU]/min | INTRAVENOUS | Status: DC
  Administered 2017-07-14: 2 m[IU]/min via INTRAVENOUS
  Administered 2017-07-14: 3 m[IU]/min via INTRAVENOUS
  Administered 2017-07-14: 5 m[IU]/min via INTRAVENOUS

## 2017-07-14 MED ORDER — LACTATED RINGERS IV SOLN
INTRAVENOUS | Status: DC
  Administered 2017-07-14 (×3): via INTRAVENOUS

## 2017-07-14 MED ORDER — OXYTOCIN 40 UNITS IN LACTATED RINGERS INFUSION - SIMPLE MED
2.5000 [IU]/h | INTRAVENOUS | Status: DC
  Filled 2017-07-14: qty 1000

## 2017-07-14 MED ORDER — TERBUTALINE SULFATE 1 MG/ML IJ SOLN
0.2500 mg | Freq: Once | INTRAMUSCULAR | Status: AC | PRN
  Administered 2017-07-15: 0.25 mg via SUBCUTANEOUS
  Filled 2017-07-14: qty 1

## 2017-07-14 MED ORDER — FENTANYL CITRATE (PF) 100 MCG/2ML IJ SOLN
50.0000 ug | INTRAMUSCULAR | Status: DC | PRN
  Administered 2017-07-14: 100 ug via INTRAVENOUS
  Administered 2017-07-14: 50 ug via INTRAVENOUS
  Administered 2017-07-14: 100 ug via INTRAVENOUS
  Filled 2017-07-14 (×3): qty 2

## 2017-07-14 MED ORDER — LIDOCAINE HCL (PF) 1 % IJ SOLN
INTRAMUSCULAR | Status: DC | PRN
  Administered 2017-07-14 (×2): 4 mL via EPIDURAL

## 2017-07-14 NOTE — Anesthesia Procedure Notes (Signed)
Epidural Patient location during procedure: OB  Staffing Anesthesiologist: Mordechai Matuszak, MD Performed: anesthesiologist   Preanesthetic Checklist Completed: patient identified, pre-op evaluation, timeout performed, IV checked, risks and benefits discussed and monitors and equipment checked  Epidural Patient position: sitting Prep: site prepped and draped and DuraPrep Patient monitoring: heart rate, continuous pulse ox and blood pressure Approach: midline Location: L3-L4 Injection technique: LOR air and LOR saline  Needle:  Needle type: Tuohy  Needle gauge: 17 G Needle length: 9 cm Needle insertion depth: 7 cm Catheter type: closed end flexible Catheter size: 19 Gauge Catheter at skin depth: 12 cm Test dose: negative  Assessment Sensory level: T8 Events: blood not aspirated, injection not painful, no injection resistance, negative IV test and no paresthesia  Additional Notes Reason for block:procedure for pain     

## 2017-07-14 NOTE — Anesthesia Preprocedure Evaluation (Signed)
Anesthesia Evaluation  Patient identified by MRN, date of birth, ID band Patient awake    Reviewed: Allergy & Precautions, NPO status , Patient's Chart, lab work & pertinent test results  Airway Mallampati: II  TM Distance: >3 FB Neck ROM: Full    Dental no notable dental hx.    Pulmonary neg pulmonary ROS,    Pulmonary exam normal breath sounds clear to auscultation       Cardiovascular negative cardio ROS Normal cardiovascular exam Rhythm:Regular Rate:Normal     Neuro/Psych negative neurological ROS  negative psych ROS   GI/Hepatic negative GI ROS, Neg liver ROS,   Endo/Other  negative endocrine ROS  Renal/GU negative Renal ROS     Musculoskeletal negative musculoskeletal ROS (+)   Abdominal (+) + obese,   Peds  Hematology  (+) anemia ,   Anesthesia Other Findings   Reproductive/Obstetrics (+) Pregnancy                             Anesthesia Physical Anesthesia Plan  ASA: II  Anesthesia Plan: Epidural   Post-op Pain Management:    Induction:   PONV Risk Score and Plan:   Airway Management Planned:   Additional Equipment:   Intra-op Plan:   Post-operative Plan:   Informed Consent: I have reviewed the patients History and Physical, chart, labs and discussed the procedure including the risks, benefits and alternatives for the proposed anesthesia with the patient or authorized representative who has indicated his/her understanding and acceptance.     Plan Discussed with:   Anesthesia Plan Comments:         Anesthesia Quick Evaluation

## 2017-07-14 NOTE — Progress Notes (Signed)
Armanda MagicZaria Giesler is a 22 y.o. G2P0010 at 5725w2d admitted for induction of labor due to Poor fetal growth.  Subjective: Patient reports rare contractions, no pain, and was able to sleep.   Objective: Vitals:   07/14/17 0221 07/14/17 0445 07/14/17 0600 07/14/17 0623  BP: 127/72  104/67   Pulse: 86  86   Resp: 18  18   Temp:    98 F (36.7 C)  TempSrc:    Oral  Weight:  82.5 kg (181 lb 14.4 oz)    Height:  5\' 3"  (1.6 m)      FHT:  FHR: 125 bpm, variability: moderate,  accelerations:  Present,  decelerations:  Absent UC:   rare SVE:   Dilation: Fingertip Effacement (%): Thick Station: -3 Exam by:: Alwin Lanigan, cnm  Labs: Lab Results  Component Value Date   WBC 7.9 07/14/2017   HGB 11.8 (L) 07/14/2017   HCT 34.8 (L) 07/14/2017   MCV 80.6 07/14/2017   PLT 242 07/14/2017    Assessment / Plan: Induction of labor due to IUGR per Dr. Mora ApplPinn, progressing on cytotec  Labor: Progressing normally Preeclampsia:  no signs or symptoms of toxicity, intake and ouput balanced and labs stable Fetal Wellbeing:  Category I Pain Control:  Epidural and Nitrous Oxide I/D:  n/a Anticipated MOD:  NSVD  Janeece RiggersEllis K Madhuri Vacca 07/14/2017, 6:23 AM
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
## 2017-07-14 NOTE — Progress Notes (Signed)
Tammy Cruz is a 22 y.o. G2P0010 at 2281w2d admitted for induction of labor due to Poor fetal growth.  Subjective: Patient contracting regularly but reports only feeling mild pressure, no discomfort. As foley bulb is out, discussed possible AROM with patient, including the risks and benefits. Patient declines AROM at this time.   Objective: Vitals:   07/14/17 1900 07/14/17 1930 07/14/17 1931 07/14/17 2001  BP: 112/65 115/65 115/65 114/64  Pulse: 73 71 71 74  Resp: 18 18    Temp:  98.1 F (36.7 C)    TempSrc:  Oral    Weight:      Height:        FHT:  FHR: 120 bpm, variability: moderate,  accelerations:  Present,  decelerations:  Absent UC:  2.5-724min  SVE:   Dilation: 4 Effacement (%): 50 Station: -3 Exam by:: Tammy Junes. Goodman, RN  Labs: Lab Results  Component Value Date   WBC 7.9 07/14/2017   HGB 11.8 (L) 07/14/2017   HCT 34.8 (L) 07/14/2017   MCV 80.6 07/14/2017   PLT 242 07/14/2017    Assessment / Plan: Induction of labor due to IUGR per Dr. Mora ApplPinn, progressing on pitocin  Labor: Progressing normally Preeclampsia:  no signs or symptoms of toxicity, intake and ouput balanced and labs stable Fetal Wellbeing:  Category I Pain Control:  Epidural and Nitrous Oxide I/D:  n/a Anticipated MOD:  NSVD  Tammy Cruz 07/14/2017, 8:36 PM

## 2017-07-14 NOTE — Anesthesia Pain Management Evaluation Note (Signed)
  CRNA Pain Management Visit Note  Patient: Tammy Cruz, 22 y.o., female  "Hello I am a member of the anesthesia team at [facility]. We have an anesthesia team available at all times to provide care throughout the hospital, including epidural management and anesthesia for C-section. I don't know your plan for the delivery whether it a natural birth, water birth, IV sedation, nitrous supplementation, doula or epidural, but we want to meet your pain goals."   1.Was your pain managed to your expectations on prior hospitalizations?   No prior hospitalizations  2.What is your expectation for pain management during this hospitalization?     Epidural, IV pain meds and Nitrous Oxide  3.How can we help you reach that goal? support  Record the patient's initial score and the patient's pain goal.   Pain: 3  Pain Goal: 5 [facility] wants you to be able to say your pain was always managed very well.  Trellis PaganiniBREWER,Yazmyne Sara N 07/14/2017

## 2017-07-14 NOTE — Progress Notes (Signed)
Tammy Cruz is a 22 y.o. G2P0010 at 4624w2d by LMP admitted for induction of labor due to Poor fetal growth.  Subjective: Patient not feeling pain  Objective: BP 117/76   Pulse 70   Temp 98.2 F (36.8 C) (Oral)   Resp 20   Ht 5\' 3"  (1.6 m)   Wt 181 lb 14.4 oz (82.5 kg)   LMP 10/12/2016   BMI 32.22 kg/m   FHT:  FHR: 130 bpm, variability: moderate,  accelerations:  Present,  decelerations:  Absent UC:   irregular, every 8-10 minutes SVE:   Dilation: 1 Effacement (%): Thick Station: -3 Exam by: Priscille HeidelbergPINN W.  Labs: Lab Results  Component Value Date   WBC 7.9 07/14/2017   HGB 11.8 (L) 07/14/2017   HCT 34.8 (L) 07/14/2017   MCV 80.6 07/14/2017   PLT 242 07/14/2017    Assessment / Plan: Induction of labor due to IUGR, still in early labor Foley bulb placed  Preeclampsia:  no signs or symptoms of toxicity Fetal Wellbeing:  Category I Pain Control:  Labor support without medications I/D:  n/a Anticipated MOD:  NSVD  Graylin Sperling STACIA 07/14/2017, 2:55 PM

## 2017-07-15 ENCOUNTER — Encounter (HOSPITAL_COMMUNITY): Payer: Self-pay | Admitting: Obstetrics & Gynecology

## 2017-07-15 ENCOUNTER — Encounter (HOSPITAL_COMMUNITY)
Admission: RE | Disposition: A | Discharge status: Home / Self Care | Payer: Self-pay | Source: Home / Self Care | Attending: Obstetrics & Gynecology

## 2017-07-15 SURGERY — Surgical Case
Anesthesia: Epidural | Site: Abdomen | Wound class: Clean Contaminated

## 2017-07-15 MED ORDER — NALBUPHINE HCL 10 MG/ML IJ SOLN: 5.0000 mg | INTRAMUSCULAR | Status: DC | PRN

## 2017-07-15 MED ORDER — SODIUM CHLORIDE 0.9 % IR SOLN
Status: DC | PRN
  Administered 2017-07-15 (×2): 1

## 2017-07-15 MED ORDER — NALBUPHINE HCL 10 MG/ML IJ SOLN: 5.0000 mg | Freq: Once | INTRAMUSCULAR | Status: DC | PRN

## 2017-07-15 MED ORDER — OXYCODONE HCL 5 MG PO TABS
5.0000 mg | ORAL_TABLET | ORAL | Status: DC | PRN
  Administered 2017-07-16 – 2017-07-17 (×8): 5 mg via ORAL
  Filled 2017-07-15 (×8): qty 1

## 2017-07-15 MED ORDER — LACTATED RINGERS IV SOLN
INTRAVENOUS | Status: DC
  Administered 2017-07-15: 16:00:00 via INTRAVENOUS

## 2017-07-15 MED ORDER — EPHEDRINE 5 MG/ML INJ
INTRAVENOUS | Status: AC
  Filled 2017-07-15: qty 10

## 2017-07-15 MED ORDER — LIDOCAINE HCL (CARDIAC) PF 100 MG/5ML IV SOSY
PREFILLED_SYRINGE | INTRAVENOUS | Status: DC | PRN
  Administered 2017-07-15: 50 mg via INTRAVENOUS

## 2017-07-15 MED ORDER — FENTANYL CITRATE (PF) 250 MCG/5ML IJ SOLN
INTRAMUSCULAR | Status: AC
  Filled 2017-07-15: qty 5

## 2017-07-15 MED ORDER — PHENYLEPHRINE HCL 10 MG/ML IJ SOLN: INTRAMUSCULAR | Status: DC | PRN

## 2017-07-15 MED ORDER — DEXAMETHASONE SODIUM PHOSPHATE 10 MG/ML IJ SOLN
INTRAMUSCULAR | Status: DC | PRN
  Administered 2017-07-15: 10 mg via INTRAVENOUS

## 2017-07-15 MED ORDER — WITCH HAZEL-GLYCERIN EX PADS: 1.0000 "application " | MEDICATED_PAD | CUTANEOUS | Status: DC | PRN

## 2017-07-15 MED ORDER — SUCCINYLCHOLINE CHLORIDE 20 MG/ML IJ SOLN
INTRAMUSCULAR | Status: DC | PRN
  Administered 2017-07-15: 120 mg via INTRAVENOUS

## 2017-07-15 MED ORDER — DIPHENHYDRAMINE HCL 25 MG PO CAPS
25.0000 mg | ORAL_CAPSULE | Freq: Four times a day (QID) | ORAL | Status: DC | PRN
  Administered 2017-07-15: 25 mg via ORAL
  Filled 2017-07-15: qty 1

## 2017-07-15 MED ORDER — OXYCODONE HCL 5 MG PO TABS
10.0000 mg | ORAL_TABLET | ORAL | Status: DC | PRN
  Administered 2017-07-18: 10 mg via ORAL
  Filled 2017-07-15: qty 2

## 2017-07-15 MED ORDER — SCOPOLAMINE 1 MG/3DAYS TD PT72
MEDICATED_PATCH | TRANSDERMAL | Status: DC | PRN
  Administered 2017-07-15: 1 via TRANSDERMAL

## 2017-07-15 MED ORDER — TETANUS-DIPHTH-ACELL PERTUSSIS 5-2.5-18.5 LF-MCG/0.5 IM SUSP: 0.5000 mL | Freq: Once | INTRAMUSCULAR | Status: DC

## 2017-07-15 MED ORDER — SENNOSIDES-DOCUSATE SODIUM 8.6-50 MG PO TABS
2.0000 | ORAL_TABLET | ORAL | Status: DC
  Administered 2017-07-15 – 2017-07-17 (×3): 2 via ORAL
  Filled 2017-07-15 (×3): qty 2

## 2017-07-15 MED ORDER — ONDANSETRON HCL 4 MG/2ML IJ SOLN: 4.0000 mg | Freq: Three times a day (TID) | INTRAMUSCULAR | Status: DC | PRN

## 2017-07-15 MED ORDER — OXYTOCIN 40 UNITS IN LACTATED RINGERS INFUSION - SIMPLE MED: 2.5000 [IU]/h | INTRAVENOUS | Status: AC

## 2017-07-15 MED ORDER — ONDANSETRON HCL 4 MG/2ML IJ SOLN
INTRAMUSCULAR | Status: DC | PRN
  Administered 2017-07-15: 4 mg via INTRAVENOUS

## 2017-07-15 MED ORDER — PROPOFOL 10 MG/ML IV BOLUS
INTRAVENOUS | Status: DC | PRN
  Administered 2017-07-15: 200 mg via INTRAVENOUS

## 2017-07-15 MED ORDER — MENTHOL 3 MG MT LOZG: 1.0000 | LOZENGE | OROMUCOSAL | Status: DC | PRN

## 2017-07-15 MED ORDER — SODIUM BICARBONATE 8.4 % IV SOLN
INTRAVENOUS | Status: DC | PRN
  Administered 2017-07-15: 5 mL via EPIDURAL
  Administered 2017-07-15: 10 mL via EPIDURAL

## 2017-07-15 MED ORDER — ZOLPIDEM TARTRATE 5 MG PO TABS: 5.0000 mg | ORAL_TABLET | Freq: Every evening | ORAL | Status: DC | PRN

## 2017-07-15 MED ORDER — DEXAMETHASONE SODIUM PHOSPHATE 10 MG/ML IJ SOLN
INTRAMUSCULAR | Status: AC
  Filled 2017-07-15: qty 1

## 2017-07-15 MED ORDER — MORPHINE SULFATE (PF) 0.5 MG/ML IJ SOLN
INTRAMUSCULAR | Status: DC | PRN
  Administered 2017-07-15: 3 mg via EPIDURAL

## 2017-07-15 MED ORDER — MIDAZOLAM HCL 2 MG/2ML IJ SOLN
INTRAMUSCULAR | Status: AC
  Filled 2017-07-15: qty 2

## 2017-07-15 MED ORDER — EPHEDRINE SULFATE 50 MG/ML IJ SOLN
INTRAMUSCULAR | Status: DC | PRN
  Administered 2017-07-15: 10 mg via INTRAVENOUS

## 2017-07-15 MED ORDER — SIMETHICONE 80 MG PO CHEW: 80.0000 mg | CHEWABLE_TABLET | ORAL | Status: DC | PRN

## 2017-07-15 MED ORDER — FENTANYL CITRATE (PF) 100 MCG/2ML IJ SOLN
INTRAMUSCULAR | Status: DC | PRN
  Administered 2017-07-15 (×3): 50 ug via INTRAVENOUS

## 2017-07-15 MED ORDER — SCOPOLAMINE 1 MG/3DAYS TD PT72
MEDICATED_PATCH | TRANSDERMAL | Status: AC
  Filled 2017-07-15: qty 1

## 2017-07-15 MED ORDER — SCOPOLAMINE 1 MG/3DAYS TD PT72
1.0000 | MEDICATED_PATCH | Freq: Once | TRANSDERMAL | Status: DC
  Filled 2017-07-15: qty 1

## 2017-07-15 MED ORDER — CEFAZOLIN SODIUM-DEXTROSE 2-3 GM-%(50ML) IV SOLR
INTRAVENOUS | Status: DC | PRN
  Administered 2017-07-15: 2 g via INTRAVENOUS

## 2017-07-15 MED ORDER — MORPHINE SULFATE (PF) 0.5 MG/ML IJ SOLN
INTRAMUSCULAR | Status: AC
  Filled 2017-07-15: qty 10

## 2017-07-15 MED ORDER — NALOXONE HCL 4 MG/10ML IJ SOLN
1.0000 ug/kg/h | INTRAMUSCULAR | Status: DC | PRN
  Filled 2017-07-15: qty 5

## 2017-07-15 MED ORDER — PHENYLEPHRINE 40 MCG/ML (10ML) SYRINGE FOR IV PUSH (FOR BLOOD PRESSURE SUPPORT)
PREFILLED_SYRINGE | INTRAVENOUS | Status: AC
  Filled 2017-07-15: qty 10

## 2017-07-15 MED ORDER — SIMETHICONE 80 MG PO CHEW
80.0000 mg | CHEWABLE_TABLET | ORAL | Status: DC
  Administered 2017-07-15 – 2017-07-17 (×3): 80 mg via ORAL
  Filled 2017-07-15 (×3): qty 1

## 2017-07-15 MED ORDER — DIPHENHYDRAMINE HCL 50 MG/ML IJ SOLN
12.5000 mg | INTRAMUSCULAR | Status: DC | PRN
  Administered 2017-07-15: 12.5 mg via INTRAVENOUS
  Filled 2017-07-15: qty 1

## 2017-07-15 MED ORDER — ACETAMINOPHEN 325 MG PO TABS: 650.0000 mg | ORAL_TABLET | ORAL | Status: DC | PRN

## 2017-07-15 MED ORDER — LACTATED RINGERS IV SOLN
INTRAVENOUS | Status: DC | PRN
  Administered 2017-07-15 (×2): via INTRAVENOUS

## 2017-07-15 MED ORDER — SODIUM CHLORIDE 0.9% FLUSH: 3.0000 mL | INTRAVENOUS | Status: DC | PRN

## 2017-07-15 MED ORDER — LIDOCAINE HCL (CARDIAC) PF 100 MG/5ML IV SOSY
PREFILLED_SYRINGE | INTRAVENOUS | Status: AC
  Filled 2017-07-15: qty 5

## 2017-07-15 MED ORDER — NALOXONE HCL 0.4 MG/ML IJ SOLN: 0.4000 mg | INTRAMUSCULAR | Status: DC | PRN

## 2017-07-15 MED ORDER — PRENATAL MULTIVITAMIN CH
1.0000 | ORAL_TABLET | Freq: Every day | ORAL | Status: DC
  Administered 2017-07-15 – 2017-07-17 (×3): 1 via ORAL
  Filled 2017-07-15 (×3): qty 1

## 2017-07-15 MED ORDER — DEXAMETHASONE SODIUM PHOSPHATE 4 MG/ML IJ SOLN
INTRAMUSCULAR | Status: DC | PRN
  Administered 2017-07-15: 4 mg via INTRAVENOUS

## 2017-07-15 MED ORDER — IBUPROFEN 600 MG PO TABS
600.0000 mg | ORAL_TABLET | Freq: Four times a day (QID) | ORAL | Status: DC
  Administered 2017-07-15 – 2017-07-18 (×12): 600 mg via ORAL
  Filled 2017-07-15 (×12): qty 1

## 2017-07-15 MED ORDER — ONDANSETRON HCL 4 MG/2ML IJ SOLN
INTRAMUSCULAR | Status: AC
  Filled 2017-07-15: qty 2

## 2017-07-15 MED ORDER — SIMETHICONE 80 MG PO CHEW
80.0000 mg | CHEWABLE_TABLET | Freq: Three times a day (TID) | ORAL | Status: DC
  Administered 2017-07-15 – 2017-07-18 (×8): 80 mg via ORAL
  Filled 2017-07-15 (×9): qty 1

## 2017-07-15 MED ORDER — LIDOCAINE-EPINEPHRINE (PF) 2 %-1:200000 IJ SOLN
INTRAMUSCULAR | Status: AC
  Filled 2017-07-15: qty 20

## 2017-07-15 MED ORDER — PROPOFOL 10 MG/ML IV BOLUS
INTRAVENOUS | Status: AC
  Filled 2017-07-15: qty 20

## 2017-07-15 MED ORDER — COCONUT OIL OIL: 1.0000 "application " | TOPICAL_OIL | Status: DC | PRN

## 2017-07-15 MED ORDER — DIBUCAINE 1 % RE OINT: 1.0000 "application " | TOPICAL_OINTMENT | RECTAL | Status: DC | PRN

## 2017-07-15 MED ORDER — SODIUM BICARBONATE 8.4 % IV SOLN
INTRAVENOUS | Status: AC
  Filled 2017-07-15: qty 50

## 2017-07-15 MED ORDER — DIPHENHYDRAMINE HCL 25 MG PO CAPS: 25.0000 mg | ORAL_CAPSULE | ORAL | Status: DC | PRN

## 2017-07-15 MED ORDER — OXYTOCIN 10 UNIT/ML IJ SOLN
INTRAMUSCULAR | Status: AC
  Filled 2017-07-15: qty 4

## 2017-07-15 MED ORDER — MIDAZOLAM HCL 2 MG/2ML IJ SOLN
INTRAMUSCULAR | Status: DC | PRN
  Administered 2017-07-15 (×2): 1 mg via INTRAVENOUS

## 2017-07-15 SURGICAL SUPPLY — 36 items
BARRIER ADHS 3X4 INTERCEED (GAUZE/BANDAGES/DRESSINGS) ×3 IMPLANT
BENZOIN TINCTURE PRP APPL 2/3 (GAUZE/BANDAGES/DRESSINGS) ×3 IMPLANT
CHLORAPREP W/TINT 26ML (MISCELLANEOUS) ×3 IMPLANT
CLAMP CORD UMBIL (MISCELLANEOUS) IMPLANT
CLOSURE WOUND 1/2 X4 (GAUZE/BANDAGES/DRESSINGS) ×1
CLOTH BEACON ORANGE TIMEOUT ST (SAFETY) ×3 IMPLANT
DERMABOND ADVANCED (GAUZE/BANDAGES/DRESSINGS)
DERMABOND ADVANCED .7 DNX12 (GAUZE/BANDAGES/DRESSINGS) IMPLANT
DRSG OPSITE POSTOP 4X10 (GAUZE/BANDAGES/DRESSINGS) ×3 IMPLANT
ELECT REM PT RETURN 9FT ADLT (ELECTROSURGICAL) ×3
ELECTRODE REM PT RTRN 9FT ADLT (ELECTROSURGICAL) ×1 IMPLANT
EXTRACTOR VACUUM KIWI (MISCELLANEOUS) IMPLANT
GLOVE BIOGEL PI IND STRL 7.0 (GLOVE) ×3 IMPLANT
GLOVE BIOGEL PI INDICATOR 7.0 (GLOVE) ×6
GLOVE ECLIPSE 6.5 STRL STRAW (GLOVE) ×3 IMPLANT
GOWN STRL REUS W/TWL LRG LVL3 (GOWN DISPOSABLE) ×9 IMPLANT
KIT ABG SYR 3ML LUER SLIP (SYRINGE) IMPLANT
NEEDLE HYPO 25X5/8 SAFETYGLIDE (NEEDLE) IMPLANT
NS IRRIG 1000ML POUR BTL (IV SOLUTION) ×3 IMPLANT
PACK C SECTION WH (CUSTOM PROCEDURE TRAY) ×3 IMPLANT
PAD ABD 7.5X8 STRL (GAUZE/BANDAGES/DRESSINGS) ×3 IMPLANT
PAD OB MATERNITY 4.3X12.25 (PERSONAL CARE ITEMS) ×3 IMPLANT
PENCIL SMOKE EVAC W/HOLSTER (ELECTROSURGICAL) ×3 IMPLANT
RTRCTR C-SECT PINK 25CM LRG (MISCELLANEOUS) ×3 IMPLANT
STRIP CLOSURE SKIN 1/2X4 (GAUZE/BANDAGES/DRESSINGS) ×2 IMPLANT
SUT PLAIN 0 NONE (SUTURE) IMPLANT
SUT PLAIN 2 0 XLH (SUTURE) IMPLANT
SUT VIC AB 0 CT1 27 (SUTURE) ×4
SUT VIC AB 0 CT1 27XBRD ANBCTR (SUTURE) ×2 IMPLANT
SUT VIC AB 0 CTX 36 (SUTURE) ×6
SUT VIC AB 0 CTX36XBRD ANBCTRL (SUTURE) ×3 IMPLANT
SUT VIC AB 2-0 CT1 27 (SUTURE) ×2
SUT VIC AB 2-0 CT1 TAPERPNT 27 (SUTURE) ×1 IMPLANT
SUT VIC AB 4-0 KS 27 (SUTURE) ×3 IMPLANT
TOWEL OR 17X24 6PK STRL BLUE (TOWEL DISPOSABLE) ×3 IMPLANT
TRAY FOLEY W/BAG SLVR 14FR LF (SET/KITS/TRAYS/PACK) IMPLANT

## 2017-07-15 NOTE — Anesthesia Postprocedure Evaluation (Signed)
Anesthesia Post Note  Patient: Tammy Cruz  Procedure(s) Performed: CESAREAN SECTION (N/A Abdomen)     Patient location during evaluation: Mother Baby Anesthesia Type: Epidural Level of consciousness: awake Pain management: satisfactory to patient Vital Signs Assessment: post-procedure vital signs reviewed and stable Respiratory status: spontaneous breathing Cardiovascular status: stable Anesthetic complications: no    Last Vitals:  Vitals:   07/15/17 0920 07/15/17 1330  BP: 118/82   Pulse: 68   Resp: 18 18  Temp: 37.1 C 37.1 C  SpO2: 97%     Last Pain:  Vitals:   07/15/17 1330  TempSrc: Oral  PainSc: 0-No pain   Pain Goal:                 KeyCorpBURGER,Jameria Bradway

## 2017-07-15 NOTE — Op Note (Signed)
PreOp Diagnosis: 1) intrauterine pregnancy @ 2662w3d 2) fetal intolerance to labor PostOp Diagnosis: same and OP presentation Procedure: Primary C-section Surgeon: Dr. Myna HidalgoJennifer Alanis Clift Assistant: Kathalene FramesEllis Greer, CNM Anesthesia: general Complications: none EBL: 757cc UOP: 150cc Fluids: 1800   Findings: Female infant from OP presentation with body cord noted.  Normal uterus, tubes and ovaries bilaterally.  PROCEDURE:  Informed consent was obtained from the patient with risks, benefits, complications, treatment options, and expected outcomes discussed with the patient.  The patient concurred with the proposed plan, giving informed consent with form signed.   The patient was taken to Operating Room, and identified with the procedure verified as C-Section Delivery with Time Out. With induction of anesthesia, the patient was prepped and draped in the usual sterile fashion. A Pfannenstiel incision was made and carried down through the subcutaneous tissue to the fascia. The fascia was incised in the midline and extended transversely. The rectus muscle was dissected midline.  The peritoneum was identified and entered. The Alexis retractor was placed.  The utero-vesical peritoneal reflection was identified and incised transversely with the [facility] scissors, the incision extended laterally, the bladder flap created digitally. A low transverse uterine incision was made and the infants head delivered atraumatically. After the umbilical cord was clamped and cut cord blood was obtained for evaluation.   The placenta was removed intact and appeared normal. The uterine outline, tubes and ovaries appeared normal. The uterine incision was closed with running locked sutures of 0 Vicryl and a second layer of the same stitch was used in an imbricating fashion.  Excellent hemostasis was obtained.  The pericolic gutters were then cleared of all clots and debris. Interceed was placed.  The peritoneum was closed in a running fashion.  The fascia was then reapproximated with running sutures of 0 Vicryl. The subcutaneous tissue was reapproximated with 2-0 plain gut suture.  The skin was closed with 4-0 vicryl in a subcuticular fashion.  Instrument, sponge, and needle counts were correct prior the abdominal closure and at the conclusion of the case. The patient was taken to recovery in stable condition.  Myna HidalgoJennifer Marlinda Miranda, DO [phone] (cell) [phone] (office)

## 2017-07-15 NOTE — Addendum Note (Signed)
Addendum  created 07/15/17 1402 by Algis GreenhouseBurger, Maryela Tapper A, CRNA   Sign clinical note

## 2017-07-15 NOTE — Anesthesia Postprocedure Evaluation (Signed)
Anesthesia Post Note  Patient: Tammy Cruz  Procedure(s) Performed: CESAREAN SECTION (N/A Abdomen)     Patient location during evaluation: PACU Anesthesia Type: General Level of consciousness: sedated and patient cooperative Pain management: pain level controlled Vital Signs Assessment: post-procedure vital signs reviewed and stable Respiratory status: spontaneous breathing Cardiovascular status: stable Postop Assessment: epidural receding Anesthetic complications: no Comments: Converted to GA    Last Vitals:  Vitals:   07/15/17 0556 07/15/17 0608  BP:  101/65  Pulse:  73  Resp: 18 16  Temp: (!) 36.3 C 36.4 C  SpO2:  95%    Last Pain:  Vitals:   07/15/17 0608  TempSrc: Axillary  PainSc: 0-No pain   Pain Goal:                 Lewie LoronJohn Hagen Tidd

## 2017-07-15 NOTE — Progress Notes (Signed)
Tammy Cruz is a 22 y.o. G2P0010 at 5555w2d admitted for induction of labor due to Poor fetal growth.  Subjective: Called to the room by RN stating baby is having shallow late decels at 0247. Patient is seen to be mildly hypotensive and 500mg  IV bolus is ordered. Patient repositioned and baby has a decel into the 60s. Patient is repositioned to hands and knees, oxygen mask applied, pitocin discontinued, terbutaline given. Fetal heart rate returns to baseline 125 and then there is another decel to 60s. FSE was applied and heart rate returned to baseline of 120s with moderate variability. Patient repositioned on right side. Explanation given to patient and family. Continued to closely monitor patient and FHTs.   Called to room by RN at [phone] for decel into the 60s, requested Dr. Charlotta Newtonzan be contacted. Repositioned patient to hands and knees again, mild improvement in baseline FHR to 80s-90s. Dr. Charlotta Newtonzan came to assess patient and recommended stat cesarean section for nonreassuring FHTs.   Objective: Vitals:   07/15/17 0211 07/15/17 0231 07/15/17 0300 07/15/17 0303  BP:  (!) 102/51 105/64 105/64  Pulse:  67 70 70  Resp:      Temp:      TempSrc:      SpO2: 98%     Weight:      Height:       Assessment / Plan: Induction of labor due to IUGR per Dr. Mora ApplPinn, progressing on pitocin  Nonreassuring FHTs- Category II  Labor: Fetal intolerance to labor Preeclampsia:  no signs or symptoms of toxicity, intake and ouput balanced and labs stable Fetal Wellbeing:  Category II Pain Control:  Epidural I/D:  n/a Anticipated MOD:  Stat Cesarean section  Janeece Riggersllis K Jarica Plass 07/15/2017, 3:20 AM

## 2017-07-15 NOTE — Transfer of Care (Signed)
Immediate Anesthesia Transfer of Care Note  Patient: Tammy Cruz  Procedure(s) Performed: CESAREAN SECTION (N/A Abdomen)  Patient Location: PACU  Anesthesia Type:General and Epidural  Level of Consciousness: awake, alert , oriented and patient cooperative  Airway & Oxygen Therapy: Patient Spontanous Breathing and Patient connected to nasal cannula oxygen  Post-op Assessment: Report given to RN and Post -op Vital signs reviewed and stable  Post vital signs: Reviewed and stable  Last Vitals:  Vitals Value Taken Time  BP 114/58 07/15/2017  4:42 AM  Temp    Pulse 109 07/15/2017  4:44 AM  Resp 32 07/15/2017  4:44 AM  SpO2 100 % 07/15/2017  4:44 AM  Vitals shown include unvalidated device data.  Last Pain:  Vitals:   07/15/17 0200  TempSrc: Oral  PainSc: 0-No pain         Complications: No apparent anesthesia complications

## 2017-07-15 NOTE — Plan of Care (Signed)
Pt progressing well. Bonding well with baby.

## 2017-07-15 NOTE — Consult Note (Signed)
Neonatology Note:   Attendance at C-section:    I was asked by Dr. Mora ApplPinn to attend this emergent C/S at term due to fetal distress/decel. The mother is a 22 y.o. G2P0010 at 8544w2d admitted for induction of labor due to poor fetal growth. GBS + aIAP with good prenatal care. ROM 5 hours before delivery, fluid clear. Infant fairly vigorous with good initial spontaneous cry and flexed tone followed by poor respiratory effort.  Cord cut at ~15 sec and infant brought to warmer where he was dried and stimulated.  HR <100 and >60.  CPAP initiated via NeoPuff with placement of Sao2.  Fio2 adjusted accordingly.  Lungs equal bilateral but very coarse.  CPAP increased to 6cm and then a few breathes for recruitment.  Gradual improvement in resp effort, tone and color.  NeoPuff removed.  Cry became spontaneous and regular.  Lungs clearer.  No WOB and vitals appropriate on RA.  Ap 4/8/9.  Grandmother and sister updated in waiting room.  To CN to care of Pediatrician.  Dineen Kidavid C. Leary RocaEhrmann, MD

## 2017-07-15 NOTE — Progress Notes (Signed)
Late entry-  At bedside due to prolonged decels- plan for stat C-section due to non-reassuring fetal heart tones. Risk benefits and alternatives of cesarean section were discussed with the patient including but not limited to infection, bleeding, damage to bowel , bladder and baby with the need for further surgery. Pt voiced understanding and desires to proceed.   Myna HidalgoJennifer Lollie Gunner, DO [phone] (cell) [phone] (office)

## 2017-07-15 NOTE — Anesthesia Procedure Notes (Signed)
Procedure Name: Intubation Date/Time: 07/15/2017 3:45 AM Performed by: Alvy Bimler, CRNA Pre-anesthesia Checklist: Emergency Drugs available, Suction available and Patient identified Patient Re-evaluated:Patient Re-evaluated prior to induction Oxygen Delivery Method: Circle system utilized Preoxygenation: Pre-oxygenation with 100% oxygen Induction Type: IV induction, Cricoid Pressure applied and Rapid sequence Laryngoscope Size: Mac and 3 Grade View: Grade I Tube type: Oral Tube size: 7.0 mm Number of attempts: 1 Airway Equipment and Method: Stylet Placement Confirmation: ETT inserted through vocal cords under direct vision and positive ETCO2 Secured at: 22 cm Tube secured with: Tape Dental Injury: Teeth and Oropharynx as per pre-operative assessment

## 2017-07-15 NOTE — Lactation Note (Signed)
This note was copied from a baby's chart. Lactation Consultation Note  Patient Name: Tammy Cruz ZOXWR'UToday's Date: 07/15/2017 Reason for consult: Initial assessment;1st time breastfeeding;Infant < 6lbs Initial consult: P1, SGA infant, mom has initiated BF 3 times within  past 8 hours ranging 5 to 10 minutes per  feedings. Per mom  [facility]  Has home support her mother breastfeed her and her sibling and Gearldine ShownGrandmother is with her now for  BF support . LC entered room mom doing skin to skin and infant was  asleep.  Discussed with mom  Tips for waking sleepy baby to nurse. Hunger cuing, feeding infant 8 to 12 times within 24 including nights. Mom was taught hand expression in which she expressed colostrum from both breast. LC with mom permission attempted to latch infant on  left breast with football hold but infant very sleepy and unable latch at this time. Mom will call LC when infant is cueing to feed. LC set up DEBP  to help stimulate breast milk production mom is to use  DEBP for 15 minutes.  Fitted with 27 m flange. Mom was pumping as LC left room. Mom will save colostrum if any is  expressed  to give baby.  Infant is DAT postive.      Maternal Data    Feeding Feeding Type: Breast Fed Length of feed: 10 min  LATCH Score Latch: Too sleepy or reluctant, no latch achieved, no sucking elicited.  Audible Swallowing: None  Type of Nipple: Everted at rest and after stimulation  Comfort (Breast/Nipple): Soft / non-tender  Hold (Positioning): Assistance needed to correctly position infant at breast and maintain latch.  LATCH Score: 5  Interventions Interventions: Skin to skin;Assisted with latch;Hand express;Position options;Support pillows;DEBP  Lactation Tools Discussed/Used Pump Review: Setup, frequency, and cleaning Initiated by:: Tammy Cruz, Tammy Cruz Date initiated:: 07/15/17   Consult Status Consult Status: Follow-up(Mom will call LC when  baby exhibit hunger cues) Date: 07/15/17 Follow-up type: In-patient    Tammy Cruz 07/15/2017, 11:53 AM

## 2017-07-15 NOTE — Progress Notes (Signed)
Tammy Cruz is a 22 y.o. G2P0010 at 3368w2d admitted for induction of labor due to Poor fetal growth.  Subjective: Discussed possible AROM and IUPC placement with patient including risks and benefits as she had not made change since Foley bulb removed and was not having strong contractions. Patient agreed but concerned about the discomfort as her vagina and cervix are feeling sensitive. Epidural offered and patient chose to get epidural prior to AROM and IUPC placement.   Objective: Vitals:   07/15/17 0141 07/15/17 0146 07/15/17 0151 07/15/17 0156  BP:      Pulse:      Resp:      Temp:      TempSrc:      SpO2: 97% 97% 98% 98%  Weight:      Height:        FHT:  FHR: 125 bpm, variability: moderate,  accelerations:  Present,  decelerations:  Absent UC:  2-393min  SVE:   Dilation: 4.5 Effacement (%): 50 Station: -2 Exam by:: Tammy Cruz, CNM  Labs: Lab Results  Component Value Date   WBC 7.9 07/14/2017   HGB 11.8 (L) 07/14/2017   HCT 34.8 (L) 07/14/2017   MCV 80.6 07/14/2017   PLT 242 07/14/2017    Assessment / Plan: Induction of labor due to IUGR per Dr. Mora ApplPinn, progressing on pitocin  AROM clear IUPC placed Will monitor MVUs and continue to increase pitocin as indicated   Labor: Progressing normally Preeclampsia:  no signs or symptoms of toxicity, intake and ouput balanced and labs stable Fetal Wellbeing:  Category I Pain Control:  Epidural I/D:  n/a Anticipated MOD:  NSVD  Tammy Cruz 07/15/2017, 2:18 AM

## 2017-07-16 LAB — CBC
HEMATOCRIT: 29.3 % — AB (ref 36.0–46.0)
Hemoglobin: 9.9 g/dL — ABNORMAL LOW (ref 12.0–15.0)
MCH: 28 pg (ref 26.0–34.0)
MCHC: 33.8 g/dL (ref 30.0–36.0)
MCV: 82.8 fL (ref 78.0–100.0)
Platelets: 198 10*3/uL (ref 150–400)
RBC: 3.54 MIL/uL — AB (ref 3.87–5.11)
RDW: 15.6 % — AB (ref 11.5–15.5)
WBC: 13.7 10*3/uL — AB (ref 4.0–10.5)

## 2017-07-16 MED ORDER — FERROUS SULFATE 325 (65 FE) MG PO TABS
325.0000 mg | ORAL_TABLET | Freq: Two times a day (BID) | ORAL | Status: DC
  Administered 2017-07-16 – 2017-07-18 (×4): 325 mg via ORAL
  Filled 2017-07-16 (×4): qty 1

## 2017-07-16 NOTE — Progress Notes (Signed)
Tammy Cruz [id number] Postpartum Postoperative Day # 2  Tammy Cruz, G2P1011, 726w3d, S/P IOL for IUGR/SGA turned into a Primary LT Cesarean Section due to deep variables with fetal heart rate (Non-reassuring strip). .   Subjective: Patient up ad lib, denies syncope or dizziness. Reports consuming regular diet without issues and denies N/V. Patient reports 0 bowel movement + passing flatus.  Denies issues with urination and reports bleeding is "small."  Patient is Breastfeeding and reports going well.  Desires undecided for postpartum contraception.  Pain is being appropriately managed with use of motrin/tylenol and IR oxycodone.    Objective: Patient Vitals for the past 24 hrs:  BP Temp Temp src Pulse Resp SpO2  07/16/17 0537 (!) 109/52 98 F (36.7 C) Oral 67 17 98 %  07/16/17 0138 (!) 103/46 98.4 F (36.9 C) Oral 89 17 97 %  07/16/17 0000 - - - - - 97 %  07/15/17 2145 (!) 114/56 98.5 F (36.9 C) Oral 64 18 99 %  07/15/17 2012 - - - - - 97 %  07/15/17 1715 (!) 106/55 98 F (36.7 C) Oral 63 18 95 %  07/15/17 1330 - 98.8 F (37.1 C) Oral - 18 -     Physical Exam:  General: alert, cooperative, appears stated age and no distress Mood/Affect: Flat Lungs: clear to auscultation, no wheezes, rales or rhonchi, symmetric air entry.  Heart: normal rate, regular rhythm, normal S1, S2, no murmurs, rubs, clicks or gallops. Breast: breasts appear normal, no suspicious masses, no skin or nipple changes or axillary nodes. Abdomen:  + bowel sounds, soft, non-tender Incision: healing well, Dressing marked with dried blood 50% of bandage, Honeycomb dressing Intact Uterine Fundus: firm, involution -2 Lochia: appropriate Skin: Warm, Dry. DVT Evaluation: No evidence of DVT seen on physical exam. Negative Homan's sign. No cords or calf tenderness. No significant calf/ankle edema.  Labs: Recent Labs    07/14/17 0100 07/16/17 0607  HGB 11.8* 9.9*  HCT 34.8* 29.3*  WBC 7.9 13.7*     CBG (last 3)  No results for input(s): GLUCAP in the last 72 hours.   I/O: I/O last 3 completed shifts: In: 2000 [I.V.:2000] Out: 5432 [Urine:4525; Blood:907]   Assessment Postpartum Postoperative Day # 2. Tammy Cruz, [id number], 416w3d, S/P IOL: IUGR/SGA then  primary LT Cesarean Section due to non-reassure fetal heart tones ( deep variables).  Pt stable. -2 Involution. Breast Feeding. Hemodynamically Stable.  Plan: Continue other mgmt as ordered Anemia: started on Iron VTE Prophylactics: SCD, ambulated as tolerates.  Pain control: Motrin/Tylenol/Narcotics PRN Breastfeeding, Lactation consult and Circumcision prior to discharge  Pt now would like in-pt circ before going home.   Dr. Mora ApplPinn to be updated on patient status  Nakisha Chai NP-C, CNM 07/16/2017, 12:16 PM

## 2017-07-16 NOTE — Lactation Note (Addendum)
This note was copied from a baby's chart. Lactation Consultation Note Baby 1827 hrs old. Baby has no interest in BF when on the breast. Baby sucks on hand and cues, when placed to breast will not BF. Suckle a few times then tongue thrust nipple out of mouth.  Discussed w/mom the need to supplement d/t baby's weight, not BF, and + DAT. Mom asked what is DAT?Marland Kitchen. Explained briefly. Mom hadn't pumped any all night and was encouraged by RN. Mom pumping when LC entered rm. Mom collected approx. 0.5 ml stressed importance of pumping every 3 hrs for stimulation. And supplement. Mom refuses to supplement w/formula but states she is worried the baby will not feed. LC expressed concerns that baby isn't cluster feeding at 25 hrs old. Discussed that + DAT can cause jaundice and make the baby sleepy and have poor feedings.  Mom concerned she has too big of nipple for baby. Baby latched well to nipple but wouldn't sustain latch not even for 30 sec. Encouraged mom to place baby STS, pump, hand express and give the baby supplement. Stressed importance of notifying RN immediately if baby becomes jittery that baby's blood sugar may drop d/t not feeding. Grandmother stated mom was concerned baby would loose to much weight and have to go to NICU. LC stated then the baby need some kind of nutrition.   Reported to RN, CN RN, and [facility].  Patient Name: Tammy Cruz [id number]'s Date: 07/16/2017 Reason for consult: Follow-up assessment;Difficult latch;Infant < 6lbs   Maternal Data Does the patient have breastfeeding experience prior to this delivery?: No  Feeding Feeding Type: Breast Fed Length of feed: 0 min  LATCH Score Latch: Too sleepy or reluctant, no latch achieved, no sucking elicited.  Audible Swallowing: None  Type of Nipple: Everted at rest and after stimulation  Comfort (Breast/Nipple): Soft / non-tender  Hold (Positioning): Full assist, staff holds infant at breast  LATCH Score:  4  Interventions    Lactation Tools Discussed/Used Tools: Pump Breast pump type: Double-Electric Breast Pump   Consult Status Consult Status: Follow-up Date: 07/16/17 Follow-up type: In-patient    Charyl DancerCARVER, [name] 07/16/2017, 7:34 AM

## 2017-07-17 NOTE — Progress Notes (Signed)
Tammy Cruz [id number] Postpartum Postoperative Day # 3  Tammy Cruz, G2P1011, 373w3d, S/P IOL for IUGR/SGA turned into a Primary LT Cesarean Section due to deep variables with fetal heart rate (Non-reassuring strip). .   Subjective: Pt in bed awake and breastfeeding infant now. Patient up ad lib, denies syncope or dizziness. Reports consuming regular diet without issues and denies N/V. Patient reports 0 bowel movement + passing flatus.  Denies issues with urination and reports bleeding is "small."  Patient is Breastfeeding and reports going well.  Desires undecided for postpartum contraception.  Pain is being appropriately managed with use of motrin/tylenol and IR oxycodone.    Objective: Patient Vitals for the past 24 hrs:  BP Temp Temp src Pulse Resp SpO2  07/16/17 2238 (!) 106/56 98.3 F (36.8 C) Oral 77 - -  07/16/17 0537 (!) 109/52 98 F (36.7 C) Oral 67 17 98 %     Physical Exam:  General: alert, cooperative, appears stated age and no distress Mood/Affect: Flat Lungs: clear to auscultation, no wheezes, rales or rhonchi, symmetric air entry.  Heart: normal rate, regular rhythm, normal S1, S2, no murmurs, rubs, clicks or gallops. Breast: breasts appear normal, no suspicious masses, no skin or nipple changes or axillary nodes. Abdomen:  + bowel sounds, soft, non-tender Incision: healing well, Dressing marked with dried blood 50% of bandage, Honeycomb dressing Intact Uterine Fundus: firm, involution -2 Lochia: appropriate Skin: Warm, Dry. DVT Evaluation: No evidence of DVT seen on physical exam. Negative Homan's sign. No cords or calf tenderness. No significant calf/ankle edema.  Labs: Recent Labs    07/16/17 0607  HGB 9.9*  HCT 29.3*  WBC 13.7*    CBG (last 3)  No results for input(s): GLUCAP in the last 72 hours.   I/O: I/O last 3 completed shifts: In: -  Out: 3225 [Urine:3225]   Assessment Postpartum Postoperative Day # 3. Tammy Cruz, [id number],  5573w3d, S/P IOL: IUGR/SGA then  primary LT Cesarean Section due to non-reassure fetal heart tones ( deep variables).  Pt stable. -3 Involution. Breast Feeding. Hemodynamically Stable.  Plan: Continue other mgmt as ordered Anemia: Iron VTE Prophylactics: SCD, ambulated as tolerates.  Pain control: Motrin/Tylenol/Narcotics PRN Breastfeeding, Lactation consult and Circumcision prior to discharge  Pt infant will be circ'ed at 0800 by Dr Mora ApplPinn  Dr. Mora ApplPinn to be updated on patient status  Chioke Noxon NP-C, CNM 07/17/2017, 4:08 AM

## 2017-07-17 NOTE — Lactation Note (Signed)
This note was copied from a baby's chart. Lactation Consultation Note Baby 967 hrs old. BF well. Mom feels better about feedings. Baby is now cluster feeding. Has adequate I&O. Mom has semi flat compressible nipples. Shells given to assist in everting nipples.  Encouraged frequently to reposition fingers further away from nipple to obtain deeper latch. Mom hand expressed colostrum when latches. Easily expressed. Discussed mature milk and consistency.  Answered questions. Praised mom for good documentation and timely feedings.  Patient Name: Tammy Cruz Reason for consult: Follow-up assessment;Infant < 6lbs   Maternal Data    Feeding Feeding Type: Breast Fed Length of feed: 15 min(still BF)  LATCH Score Latch: Grasps breast easily, tongue down, lips flanged, rhythmical sucking.  Audible Swallowing: Spontaneous and intermittent  Type of Nipple: Flat(semi flat)  Comfort (Breast/Nipple): Soft / non-tender  Hold (Positioning): Assistance needed to correctly position infant at breast and maintain latch.  LATCH Score: 8  Interventions Interventions: Breast feeding basics reviewed;Support pillows;Assisted with latch;Position options;Skin to skin;Breast massage;Hand express;Shells;Hand pump;Breast compression;Adjust position;DEBP  Lactation Tools Discussed/Used Tools: Shells;Pump Breast pump type: Double-Electric Breast Pump   Consult Status Consult Status: Follow-up Date: 07/18/17 Follow-up type: In-patient    Tammy Cruz, Tammy Cruz Cruz, 10:59 PM

## 2017-07-17 NOTE — Progress Notes (Signed)
Subjective: Postpartum Day 2: Cesarean Delivery Patient reports incisional pain but controlled with PO meds, tolerating PO, + flatus and no problems voiding.   She denies CP/ SOB/ N/V Objective: Vital signs in last 24 hours: Temp:  [97.9 F (36.6 C)-98.3 F (36.8 C)] 97.9 F (36.6 C) (06/29 0500) Pulse Rate:  [77-80] 80 (06/29 0500) BP: (101-106)/(40-56) 101/40 (06/29 0500)  Physical Exam:  General: alert, cooperative and no distress Lochia: appropriate Uterine Fundus: firm Incision: healing well, no significant drainage, no dehiscence DVT Evaluation: No evidence of DVT seen on physical exam. Negative Homan's sign. No cords or calf tenderness.  Recent Labs    07/16/17 0607  HGB 9.9*  HCT 29.3*    Assessment/Plan: Status post Cesarean section. Doing well postoperatively.  Continue current care. Due to small size and feeding issue baby needs to stay one more day,  Will discharge patient tomorrow morning.  Circumcision done   Essie HartINN, Charmain Diosdado STACIA 07/17/2017, 3:01 PM

## 2017-07-17 NOTE — Lactation Note (Addendum)
This note was copied from a baby's chart. Lactation Consultation Note Baby 4346 hrs old. Mom has been complaining of baby not feeding. Baby has been sleepy and wouldn't stay latched. Mom stated baby has started feeding. Baby has only documented 5 feedings in 24 hrs. Explained that isn't exceptable for the baby. Baby should be cluster feeding.  After talking to mom, mom stated that she would put the baby on the breast for 10 min. Then he would nap, then 10 min. Another time. Mom stated baby was feeding more often that what is documented. RN explained what is documented is what they tell staff. MGM stated it was her fault that she hasn't been documenting all of the feedings. Stressed importance of accurate I&O. Baby has had in 45 hrs 8 stools and 8 voids. The baby must been getting something from mom to have that much output.  Explained to mom staff is concerned d/t mom concerns and not having accurate documentation of I&O makes baby appear as if not receiving adequate nutrition Patient Name: Tammy Cruz Reason for consult: Follow-up assessment;Infant < 6lbs   Maternal Data Has patient been taught Hand Expression?: Yes Does the patient have breastfeeding experience prior to this delivery?: No  Feeding    LATCH Score                   Interventions    Lactation Tools Discussed/Used     Consult Status Consult Status: Follow-up Date: 07/17/17 Follow-up type: In-patient    Tammy Cruz, Tammy Cruz Cruz, 2:19 AM

## 2017-07-18 MED ORDER — OXYCODONE HCL 5 MG PO TABS: 5.0000 mg | ORAL_TABLET | ORAL | 0 refills | Status: DC | PRN

## 2017-07-18 MED ORDER — FERROUS SULFATE 325 (65 FE) MG PO TABS: 325.0000 mg | ORAL_TABLET | Freq: Two times a day (BID) | ORAL | 3 refills | Status: DC

## 2017-07-18 MED ORDER — IBUPROFEN 600 MG PO TABS: 600.0000 mg | ORAL_TABLET | Freq: Four times a day (QID) | ORAL | 0 refills | Status: DC

## 2017-07-18 NOTE — Lactation Note (Signed)
This note was copied from a baby's chart. Lactation Consultation Note  Patient Name: Tammy Cruz [id number]'s Date: 07/18/2017 Reason for consult: Follow-up assessment;Infant < 6lbs;Infant weight loss;Difficult latch  Visited with P1 Mom of 6879 hr old baby born at 2072w3d, with weight loss of 10%.  MD ordered supplementation with formula (30 ml) and will reassess after weight check with Pediatrician tomorrow. Mom declines double pumping, but has single pumped a few times.  [facility] Mom interested in using SNS at the breast.    Mom trying to latch baby without using adequate support, and sandwiching breast too close to nipple.  SNS placed with instructions on use.  Both Mom and [facility] became anxious and doubting LC assistance.  SNS taken off breast, and encouraged Mom to latch baby without.  Baby latching shallow on the breast, and popping off.  Hand expression for transitional milk noted. Due to increased tension in room, and baby not attaining a deep latch, SNS taped to Mom's gloved finger (did not want to use her bare finger), and assisted in stimulating baby's suck.   Mom to pump after each feeding, and supplement per MD order 30 ml of EBM+/formula by SNS.  Clinic message sent for OP lactation appointment.  Mom very interested. Mom aware of OP BFSG. Encouraged to call prn.   Feeding Feeding Type: Formula Length of feed: 3 min  LATCH Score Latch: Repeated attempts needed to sustain latch, nipple held in mouth throughout feeding, stimulation needed to elicit sucking reflex.  Audible Swallowing: None  Type of Nipple: Everted at rest and after stimulation  Comfort (Breast/Nipple): Soft / non-tender  Hold (Positioning): Full assist, staff holds infant at breast  LATCH Score: 5  Interventions Interventions: Breast feeding basics reviewed;Assisted with latch;Skin to skin;Breast massage;Hand express;Pre-pump if needed;Breast compression;Adjust  position;Support pillows;Position options;Expressed milk;DEBP  Lactation Tools Discussed/Used Tools: Pump Breast pump type: Double-Electric Breast Pump   Consult Status Consult Status: Complete Date: 07/21/17 Follow-up type: Out-patient    Judee ClaraSmith, Trejan Buda E 07/18/2017, 11:24 AM

## 2017-07-18 NOTE — Discharge Summary (Signed)
Primary CS OB Discharge Summary     Patient Name: Tammy Cruz DOB: 04/10/1995 MRN: [id number]  Date of admission: 07/14/2017 Delivering MD: Myna HidalgoZAN, JENNIFER  Date of delivery: 06/27 Type of delivery: Primary c-section  CCOB pt.   Newborn Data: Sex: BBB Circumcision: Done Live born female  Birth Weight: 5 lb 13 oz (2637 g) APGAR: 4, 8  Newborn Delivery   Birth date/time:  07/15/2017 03:46:00 Delivery type:  C-Section, Low Transverse Trial of labor:  Yes C-section categorization:  Primary     Feeding: breast Infant being discharge to home with mother in stable condition.   Admitting diagnosis: INDUCTION Intrauterine pregnancy: 4163w3d     Secondary diagnosis:  Active Problems:   Encounter for planned induction of labor                                Complications: None                                                              Intrapartum Procedures: cesarean: low cervical, transverse and Non reassure fetal tones Postpartum Procedures: none Complications-Operative and Postpartum: none and anemia Augmentation: AROM, Pitocin, Cytotec and Foley Balloon   History of Present Illness: Tammy Cruz is a 22 y.o. female, G2P1011, who presents at 463w3d weeks gestation. [facility]  Her pregnancy has been complicated by: IOL for IUGR/SGA and had non-reassure feta tone then was sectioned.   Patient Active Problem List   Diagnosis Date Noted  . Encounter for planned induction of labor 07/14/2017    Hospital Course--Unscheduled Cesarean:  Admitted 06/26. Positive GBS and treated.  Utilized general epidural for pain management.  Due to non reassuring fetal tones, she was consented for cesarean, with Dr. Charlotta Newtonzan performing a Primary LTCS under general anesthesia, with delivery of a viable BB, with weight and Apgars as listed below. Infant was in good condition and remained at the patient's bedside.  The  patient was taken to recovery in good condition.  Patient planned to Breast feed.  On post-op day 1, patient was doing well, tolerating a regular diet, with Hgb of 9.9 and placed on iron.  Throughout her stay, her physical exam was WNL, her incision was CDI, and her vital signs remained stable.  By post-op day 2, she was up ad lib, tolerating a regular diet, with good pain control with po med.  She was deemed to have received the full benefit of her hospital stay, and was discharged home in stable condition.  Contraceptive choice was undecided for now. Pt remains in stable condition in bed with mom at bedside and baby in arms, bonding well.     Physical exam  Vitals:   07/17/17 0500 07/17/17 1539 07/17/17 2125 07/18/17 0521  BP: (!) 101/40 120/64 113/60 105/68  Pulse: 80 69 67 87  Resp:  16 16 18   Temp: 97.9 F (36.6 C) 98.4 F (36.9 C) 98.2 F (36.8 C) 97.8 F (36.6 C)  TempSrc: Oral Oral Oral Axillary  SpO2:  100% 100%   Weight:      Height:       General: alert, cooperative and no distress Lochia: appropriate  Uterine Fundus: firm Incision: Healing well with no significant drainage, dressing has dried blood on 50% of dressing, otherwise it is intact. ,  Perineum: Intact DVT Evaluation: No evidence of DVT seen on physical exam. Negative Homan's sign. No cords or calf tenderness. No significant calf/ankle edema.  Labs: Lab Results  Component Value Date   WBC 13.7 (H) 07/16/2017   HGB 9.9 (L) 07/16/2017   HCT 29.3 (L) 07/16/2017   MCV 82.8 07/16/2017   PLT 198 07/16/2017   CMP Latest Ref Rng & Units 10/15/2016  Glucose 65 - 99 mg/dL 161(W)  BUN 6 - 20 mg/dL 7  Creatinine 9.60 - 4.54 mg/dL 0.98  Sodium 119 - 147 mmol/L 140  Potassium 3.5 - 5.1 mmol/L 4.3  Chloride 101 - 111 mmol/L 111  CO2 22 - 32 mmol/L 21(L)  Calcium 8.9 - 10.3 mg/dL 8.2(N)  Total Protein 6.5 - 8.1 g/dL 5.6(O)  Total Bilirubin 0.3 - 1.2 mg/dL <1.3(Y)  Alkaline Phos 38 - 126 U/L 37(L)  AST 15 - 41 U/L  25  ALT 14 - 54 U/L 18    Date of discharge: 07/18/2017 Discharge Diagnoses: Term Pregnancy-delivered and anemia Discharge instruction: per After Visit Summary and "Baby and Me Booklet".  After visit meds:  Allergies as of 07/18/2017      Reactions   Protopic [tacrolimus] Rash      Medication List    STOP taking these medications   Doxylamine-Pyridoxine 10-10 MG Tbec     TAKE these medications   ferrous sulfate 325 (65 FE) MG tablet Take 1 tablet (325 mg total) by mouth 2 (two) times daily with a meal.   ibuprofen 600 MG tablet Commonly known as:  ADVIL,MOTRIN Take 1 tablet (600 mg total) by mouth every 6 (six) hours.   oxyCODONE 5 MG immediate release tablet Commonly known as:  Oxy IR/ROXICODONE Take 1 tablet (5 mg total) by mouth every 4 (four) hours as needed (pain scale 4-7).   Prenatal Vitamin 27-0.8 MG Tabs Take 1 tablet daily by mouth.   triamcinolone 0.025 % ointment Commonly known as:  KENALOG triamcinolone acetonide 0.025 % topical ointment uses daily as needed for dry skin.            Discharge Care Instructions  (From admission, onward)        Start     Ordered   07/18/17 0000  Discharge wound care:    Comments:  Take dressing off on day 5-7 postpartum.  Report increased drainage, redness or warmth. Clean with water, let soap trickle down body. Can leave steri strips on until they fall off or take them off gently at day 10. Keep open to air, clean and dry.   07/18/17 0856      Activity:           pelvic rest Advance as tolerated. Pelvic rest for 6 weeks.  Diet:                routine Medications: PNV, Ibuprofen, Colace, Iron and Percocet Postpartum contraception: Undecided Condition:  Pt discharge to home with baby in stable Anemia: Iron BID  Meds: Allergies as of 07/18/2017      Reactions   Protopic [tacrolimus] Rash      Medication List    STOP taking these medications   Doxylamine-Pyridoxine 10-10 MG Tbec     TAKE these  medications   ferrous sulfate 325 (65 FE) MG tablet Take 1 tablet (325 mg total) by mouth 2 (two) times  daily with a meal.   ibuprofen 600 MG tablet Commonly known as:  ADVIL,MOTRIN Take 1 tablet (600 mg total) by mouth every 6 (six) hours.   oxyCODONE 5 MG immediate release tablet Commonly known as:  Oxy IR/ROXICODONE Take 1 tablet (5 mg total) by mouth every 4 (four) hours as needed (pain scale 4-7).   Prenatal Vitamin 27-0.8 MG Tabs Take 1 tablet daily by mouth.   triamcinolone 0.025 % ointment Commonly known as:  KENALOG triamcinolone acetonide 0.025 % topical ointment uses daily as needed for dry skin.            Discharge Care Instructions  (From admission, onward)        Start     Ordered   07/18/17 0000  Discharge wound care:    Comments:  Take dressing off on day 5-7 postpartum.  Report increased drainage, redness or warmth. Clean with water, let soap trickle down body. Can leave steri strips on until they fall off or take them off gently at day 10. Keep open to air, clean and dry.   07/18/17 0856      Discharge Follow Up:  Follow-up Information    [facility] Follow up.   Specialty:  Obstetrics and Gynecology Why:  1 week icision check and 6 weeks PPV Contact information: [address] [place] [address] [phone]           [place], NP-C, CNM 07/18/2017, 9:44 AM  Dale Parks, FNP

## 2017-07-21 ENCOUNTER — Ambulatory Visit: Payer: Self-pay

## 2017-07-21 NOTE — Lactation Note (Signed)
This note was copied from a baby's chart.  07/21/2017  Name: Tammy Cruz MRN: [id number] Date of Birth: 07/15/2017 Gestational Age: Gestational Age: 35w3dBirth Weight: 93 oz Weight today:    5 pounds 10.5 ounces (2566 grams) with clean newborn diaper  Infant presents today with mom for feeding assessment. Mom reports infant fed 2.5 ounces just before coming via syringe.   Infant has gained 206 grams in the last 3 days with an average daily weight gain of 69 grams a day.   Mom reports she has not been latching him to the breast due to nipple pain. Nipples are intact. Mom has been syringe feeding infant. [facility] a # 30 flange to use. This LC feels # 24 flange is the correct size, mom denied pain with pumping. Enc her to use coconut oil to nipples or flanged with pumping. Mom is pumping on high suction, enc her to turn down to comfortable level.   Mom is not pumping very often. She has a Symphony pump at home. Reviewed supply and demand and importance of pumping 8-12 x a day to protect supply and to prevent mastitis and plugged ducts.   Infant with thick labial frenulum that extends to gumline. Infant with divot to center of the gum ridge. Upper lip slightly tight with flanging. Tongue movement wnl. Infant with strong suckle on gloved finger with good tongue extension and cupping. Infant clicks at the breast. Nipple was rounded post feeding and mom reports pain decreased with feeding.   Attempted to latch infant to the left breast, infant was sleepy and would not latch. After about 30 minutes, infant cueing to feed. Left breast was prepumped and infant was latched to the breast in the football hold. After a few latches, infant fed well with good swallows noted. Mom reports breast softening with feeding Infant transferred 32 ml. Mom was pleased with feeding. Infant then was syringe fed 25 cc EBM and tolerated it well.   Mom pumped with manual pump and was  able to pump 3 ounces in a few minutes. Mom reports small clear blisters to right nipple. Comfort gels given with explanation for use and cleaning. Mom has been using Vaseline to her nipples, she is aware that she should not be using on the nipples as not for ingestion. Mom told she can use coconut oil or olive oil.   Mom feels she has good support at home. Mom reports she is feeling tired, enc her to rest when infant is resting.    Infant to follow up with Dr. OAlgie Coffertoday. Mom aware of BF Support Groups. [facility] to come out on Monday 7/8. Infant to follow up with Lactation in 1 week.     General Information: Mother's reason for visit: Feeding assessment Consult: Initial Lactation consultant: SNonah MattesRN,IBCLC Breastfeeding experience: not latching die to nipple pain   Maternal medications: Iron, Pre-natal vitamin  Breastfeeding History: Frequency of breast feeding: 0 Duration of feeding: 0  Supplementation: Supplement method: syringe         Breast milk volume: 2-2.5 Breast milk frequency: every 2-3 hours   Pump type: Symphony Pump frequency: 4-5 x a day Pump volume: 1.5 ounces  Infant Output Assessment: Voids per 24 hours: 6-7 Urine color: Clear yellow Stools per 24 hours: 0 in the last few days    Breast Assessment: Breast: Filling Nipple: Erect, Blister(blisters to right nipple) Pain level: 5(5-9 level) Pain interventions: Bra, Expressed  breast milk  Feeding Assessment: Infant oral assessment: Variance Infant oral assessment comment: Infant with thick labial frenulum that extends to gumline. Infant with divot to center of the gum ridge. Upper lip slightly tight with flanging. Tongue movement wnl. Infant with strong suckle on gloved finger with good tongue extension and cupping. Infant clicks at the breast. Nipple was rounded post feeding and mom reports pain decreased with feeding.  Positioning: Football(right breast) Latch: 1 - Repeated attempts  needed to sustain latch, nipple held in mouth throughout feeding, stimulation needed to elicit sucking reflex. Audible swallowing: 1 - A few with stimulation Type of nipple: 2 - Everted at rest and after stimulation Comfort: 1 - Filling, red/small blisters or bruises, mild/mod discomfort Hold: 1 - Assistance needed to correctly position infant at breast and maintain latch LATCH score: 6 Latch assessment: Deep Lips flanged: No(needed lip flanging) Suck assessment: Displays both   Pre-feed weight: 2566 grams Post feed weight: 2566 grams Amount transferred: 0 Amount supplemented: 0  Additional Feeding Assessment: Infant oral assessment: Variance Infant oral assessment comment: Infant with thick labial frenulum that extends to gumline. Infant with divot to center of the gum ridge. Upper lip slightly tight with flanging. Tongue movement wnl. Infant with strong suckle on gloved finger with good tongue extension and cupping. Infant clicks at the breast. Nipple was rounded post feeding and mom reports pain decreased with feeding.  Positioning: Football(left breast) Latch: 2 - Grasps breast easily, tongue down, lips flanged, rhythmical sucking. Audible swallowing: 2 - Spontaneous and intermittent Type of nipple: 2 - Everted at rest and after stimulation Comfort: 1 - Filling, red/small blisters or bruises, mild/mod discomfort Hold: 1 - Assistance needed to correctly position infant at breast and maintain latch LATCH score: 8 Latch assessment: Deep Lips flanged: Yes Suck assessment: Displays both   Pre-feed weight: 2626 grams in full clothing Post feed weight: 2658 grams Amount transferred: 32 ml    Totals: Total amount transferred: 32 ml Total Supplement given: 25 ml via syringe Total amount pumped post feed: 120 ml   Plan:  1. Offer the infant with the breast with feeding cues with the goal of latching to the breast at least 4 x a day and working up to all feedings at the breast 2.  Keep infant awake at the breast 3. Massage/compress breast with feedings 4. Prepump to soften nipple and areola prior to latch 5. If infant frantic when latching to the breast, offer him a syringe of pumped breast milk first 6. Zaylynn needs 47-63 ml (1.5-2 ounces) for at least 8 feedings a day or 375-500 ml (13-17 ounces) a day.  7. Offer infant pumped breast milk if not latching to the breast with the feeding AND after breast feeding if he is still cueing to feed 8. Pump for 8-12 x a day for 15-20 minutes with a double electric breast pump to empty breasts if infant is not latching to the breast AND after breast feeding 9. Use Coconut or Olive Oil to nipples/flanges prior to pumping to lubricate 10. Keep up the good work 20. Call with questions/concerns as needed [phone] 12. Thank you for allowing me to assist you today 13. Follow up with Lactation in 1 week    [place], [employer]  Debby Freiberg Ola Fawver 07/21/2017, 10:27 AM

## 2017-07-29 ENCOUNTER — Ambulatory Visit: Payer: Self-pay

## 2017-07-29 NOTE — Lactation Note (Signed)
This note was copied from a baby's chart. 07/29/2017  Name: Tammy Cruz MRN: [id number] Date of Birth: 07/15/2017 Gestational Age: Gestational Age: [redacted]w[redacted]d Birth Weight: 93 oz Weight today:    did not weigh infant as was weighed at [facility] office this morning and mom did not wish to awaken him to weigh. Infant fed before coming to appt.   Mom presents today with mom for follow up feeding assessment.   Mom reports she just fed infant and he is not needing to eat at this time. Mom reports infant weighed 6 pounds 9 ounces this morning at the Peds office. Mom did not want to weigh infant. Infant is gaining well.   Mom reports BF has improved. She reports infant feeds well and the breat with + breast softening noted with feedings. Mom denies pain with feeding.  Mom reports he sometimes needs a bottle afterwards. Mom is feeding him formula as she is wanting to create a stash of EBM. Mom is pumping about 5 x a day and getting 2 oz/pumping. She has about 10 ounces stored in the refrigerator. Reviewed breast milk storage with mom.[place] mom to feed infant her pumped EBM as available.   Mom wishes to continue pumping as she is currently. Discussed once infant is feeding well and no longer needing supplement after BF, it is ok to wean pumping down or off. Reviewed ways to wean pumping is decrease the amount of time that she is pumping with each session by dropping 2-3 minutes every 2-3 days until she is weaned off. Then she can either space put times between pumpings or she can drop one pumping a day every 2-3 days to completely wean off. Mom given handout from [email or web address] on Weaning from the Pump to use as a guide.   Mom reports infant spits when he takes a full feeding from the bottle. She is not stopping infant to burp him with feeding, enc her to burp him at least half way through and at the end of the feeding.   Reviewed growth spurts and to expect infant to want to eat more frequently at times  when going through growth spurts. Mom voiced understanding.   Mom is to follow up with Dr. Early Osmond on July 24th. Family connects was out on Monday and does not plan to return. Mom aware of BF Support Groups and WHOG and WIC. Mom wants to follow up with Lactation prn.   Mom reports all questions/concerns have been answered at this time. Mom aware to call with any questions/concerns as needed.   Mom reports she is very tired today. She reports she has good support at home as she lives with her mom.    General Information: Mother's reason for visit: follow up feeding assessment Consult: Follow-up Lactation consultant: Noralee Stain RN,IBCLC Breastfeeding experience: latching better, pumping and supplementing   Maternal medications: Pre-natal vitamin, Iron  Breastfeeding History: Frequency of breast feeding: 5-6 x a day with cues Duration of feeding: 20 minutes  Supplementation: Supplement method: bottle Brand: Similac Formula volume: 1 ounces Formula frequency: 3-4 x a day   Breast milk volume: 1-3 ounces Breast milk frequency: 2-3 x a day   Pump type: Symphony Pump frequency: 5 x a day Pump volume: 2 ounces  Infant Output Assessment: Voids per 24 hours: 8+ Urine color: Clear yellow Stools per 24 hours: 6+ Stool color: Yellow  Breast Assessment: Breast: Filling Nipple: Erect Pain level: 0 Pain interventions: Bra, Expressed breast milk  Feeding Assessment:  Infant oral assessment comment: did not assess, infant asleep                                Additional Feeding Assessment:                                    Totals:     Total amount pumped post feed: did not pump   Plan:  1. Offer the infant with the breast with feeding cues with the goal of latching to the breast at least 4 x a day and working up to all feedings at the breast 2. Keep infant awake at the breast 3. Massage/compress breast with feedings 4. Offer both breast  with each feeding, empty first breast before offering second breast 5. If infant frantic when latching to the breast, offer him pumped breast milk first 6. Burp infant at least half way through the feeding and at the end of the feeding 7. Offer infant pumped breast milk if not latching to the breast with the feeding AND after breast feeding if he is still cueing to feed, feed infant as much as he wants 8. Pump when infant receiving a bottle or if breasts are not softened after breast feeding, if planning to wean off pumping wean slowly per [email or web address] handout  9. Use Coconut or Olive Oil to nipples/flanges prior to pumping to lubricate 10. Keep up the good work 11. Call with questions/concerns as needed [phone] 12. Thank you for allowing me to assist you today 13. Follow up with Lactation as needed  Ed BlalockSharon S Tadashi Burkel RN, IBCLC                                                     Ed BlalockSharon S Seydou Hearns 07/29/2017, 3:53 PM

## 2017-08-12 MED ORDER — SODIUM CHLORIDE 0.9 % IV SOLN
INTRAVENOUS | Status: DC | PRN
  Administered 2017-07-15 (×6): 80 ug via INTRAVENOUS

## 2017-08-12 NOTE — Addendum Note (Signed)
Addendum  created 08/12/17 [id number] by Orlie PollenMerritt, Kristilyn Coltrane R, CRNA   Intraprocedure Meds edited

## 2017-12-31 DIAGNOSIS — J0100 Acute maxillary sinusitis, unspecified: Secondary | ICD-10-CM | POA: Diagnosis not present

## 2017-12-31 DIAGNOSIS — R05 Cough: Secondary | ICD-10-CM | POA: Diagnosis not present

## 2018-01-21 ENCOUNTER — Ambulatory Visit (HOSPITAL_COMMUNITY)
Admission: EM | Admit: 2018-01-21 | Discharge: 2018-01-21 | Disposition: A | Discharge status: Home / Self Care | Payer: Medicaid Other | Attending: Family Medicine | Admitting: Family Medicine

## 2018-01-21 ENCOUNTER — Encounter (HOSPITAL_COMMUNITY): Payer: Self-pay | Admitting: Emergency Medicine

## 2018-01-21 DIAGNOSIS — L03011 Cellulitis of right finger: Secondary | ICD-10-CM | POA: Diagnosis not present

## 2018-01-21 MED ORDER — CLINDAMYCIN HCL 300 MG PO CAPS: 300.0000 mg | ORAL_CAPSULE | Freq: Three times a day (TID) | ORAL | 0 refills | Status: DC

## 2018-01-21 NOTE — ED Triage Notes (Signed)
Pt c/o pulling a hang nail off her R ring finger, infection around the nail bed.

## 2018-01-21 NOTE — Discharge Instructions (Addendum)
You may use over the counter ibuprofen or acetaminophen as needed.  ° °
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
## 2018-08-25 DIAGNOSIS — S0990XA Unspecified injury of head, initial encounter: Secondary | ICD-10-CM | POA: Diagnosis not present

## 2018-08-25 DIAGNOSIS — Y999 Unspecified external cause status: Secondary | ICD-10-CM | POA: Diagnosis not present

## 2018-08-25 DIAGNOSIS — W1839XA Other fall on same level, initial encounter: Secondary | ICD-10-CM | POA: Diagnosis not present

## 2018-08-25 DIAGNOSIS — R51 Headache: Secondary | ICD-10-CM | POA: Diagnosis not present

## 2018-10-13 ENCOUNTER — Ambulatory Visit
Admission: EM | Admit: 2018-10-13 | Discharge: 2018-10-13 | Disposition: A | Discharge status: Home / Self Care | Payer: Medicaid Other | Attending: Physician Assistant | Admitting: Physician Assistant

## 2018-10-13 DIAGNOSIS — N760 Acute vaginitis: Secondary | ICD-10-CM | POA: Diagnosis not present

## 2018-10-13 DIAGNOSIS — N898 Other specified noninflammatory disorders of vagina: Secondary | ICD-10-CM

## 2018-10-13 DIAGNOSIS — B9689 Other specified bacterial agents as the cause of diseases classified elsewhere: Secondary | ICD-10-CM | POA: Diagnosis not present

## 2018-10-13 DIAGNOSIS — R21 Rash and other nonspecific skin eruption: Secondary | ICD-10-CM | POA: Diagnosis not present

## 2018-10-13 HISTORY — DX: Dermatitis, unspecified: L30.9

## 2018-10-13 MED ORDER — TRIAMCINOLONE ACETONIDE 0.025 % EX OINT: 1.0000 "application " | TOPICAL_OINTMENT | Freq: Two times a day (BID) | CUTANEOUS | 0 refills | Status: DC

## 2018-10-13 MED ORDER — METRONIDAZOLE 500 MG PO TABS: 500.0000 mg | ORAL_TABLET | Freq: Two times a day (BID) | ORAL | 0 refills | Status: DC

## 2018-10-13 MED ORDER — CLOTRIMAZOLE-BETAMETHASONE 1-0.05 % EX CREA: TOPICAL_CREAM | CUTANEOUS | 0 refills | Status: DC

## 2018-10-13 MED ORDER — CLOBETASOL PROPIONATE 0.05 % EX CREA: 1.0000 "application " | TOPICAL_CREAM | Freq: Two times a day (BID) | CUTANEOUS | 0 refills | Status: DC
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
## 2018-10-13 NOTE — Discharge Instructions (Addendum)
You were treated empirically for bacterial vaginitis. Start flagyl as directed. Cytology sent, you will be contacted with any positive results that requires further treatment. Refrain from sexual activity and alcohol use for the next 7 days. Monitor for any worsening of symptoms, fever, abdominal pain, nausea, vomiting, to follow up for reevaluation.  Use lotrisone for the toe, this will cover for eczema and ringworm. You cane use clobetasol and triamcinolone ointment for your eczema. Avoid soap for now. Make sure to moisturize skin. Follow up with PCP for further evaluation if symptoms not improving.

## 2018-10-13 NOTE — ED Triage Notes (Signed)
Pt c/o eczema to arms, ring worm to her rt foot. Pt c/o vaginal odor for past 2wks.

## 2018-10-18 LAB — CERVICOVAGINAL ANCILLARY ONLY
Chlamydia: POSITIVE — AB
Neisseria Gonorrhea: NEGATIVE

## 2018-10-20 ENCOUNTER — Telehealth: Payer: Self-pay | Admitting: Emergency Medicine

## 2018-10-20 DIAGNOSIS — A749 Chlamydial infection, unspecified: Secondary | ICD-10-CM

## 2018-10-20 LAB — CERVICOVAGINAL ANCILLARY ONLY
Bacterial Vaginitis (gardnerella): POSITIVE — AB
Candida Glabrata: NEGATIVE
Candida Vaginitis: NEGATIVE
Molecular Disclaimer: NEGATIVE
Molecular Disclaimer: NEGATIVE
Molecular Disclaimer: NEGATIVE
Molecular Disclaimer: NORMAL
Trichomonas: NEGATIVE

## 2018-10-20 MED ORDER — AZITHROMYCIN 500 MG PO TABS: 1000.0000 mg | ORAL_TABLET | Freq: Once | ORAL | 0 refills | Status: AC

## 2018-10-20 NOTE — Telephone Encounter (Signed)
Patient calling regarding positive chlamydia test.  Azithromycin called into patient's preferred pharmacy.  Viewed safe sex protocol.  Questions answered to patient satisfaction.  Return precautions discussed, patient verbalized understanding and is agreeable to plan.

## 2019-01-08 ENCOUNTER — Other Ambulatory Visit: Payer: Self-pay

## 2019-01-08 ENCOUNTER — Ambulatory Visit
Admission: EM | Admit: 2019-01-08 | Discharge: 2019-01-08 | Disposition: A | Discharge status: Home / Self Care | Payer: Medicaid Other | Attending: Physician Assistant | Admitting: Physician Assistant

## 2019-01-08 DIAGNOSIS — B9689 Other specified bacterial agents as the cause of diseases classified elsewhere: Secondary | ICD-10-CM

## 2019-01-08 DIAGNOSIS — N760 Acute vaginitis: Secondary | ICD-10-CM

## 2019-01-08 MED ORDER — FLUCONAZOLE 150 MG PO TABS: 150.0000 mg | ORAL_TABLET | Freq: Every day | ORAL | 0 refills | Status: DC

## 2019-01-08 NOTE — ED Triage Notes (Signed)
Pt c/o vaginal itching, burning and discharge x3 days
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
## 2019-01-08 NOTE — Discharge Instructions (Signed)
You were treated empirically for yeast. Start diflucan as directed. Cytology sent, you will be contacted with any positive results that requires further treatment. Refrain from sexual activity and alcohol use for the next 7 days. Monitor for any worsening of symptoms, fever, abdominal pain, nausea, vomiting, to follow up for reevaluation. ° °

## 2019-01-25 ENCOUNTER — Encounter: Payer: Self-pay | Admitting: Emergency Medicine

## 2019-01-25 ENCOUNTER — Other Ambulatory Visit: Payer: Self-pay

## 2019-01-25 ENCOUNTER — Ambulatory Visit
Admission: EM | Admit: 2019-01-25 | Discharge: 2019-01-25 | Disposition: A | Discharge status: Home / Self Care | Payer: Medicaid Other | Attending: Emergency Medicine | Admitting: Emergency Medicine

## 2019-01-25 DIAGNOSIS — R102 Pelvic and perineal pain: Secondary | ICD-10-CM | POA: Insufficient documentation

## 2019-01-25 DIAGNOSIS — N898 Other specified noninflammatory disorders of vagina: Secondary | ICD-10-CM | POA: Diagnosis not present

## 2019-01-25 MED ORDER — DOXYCYCLINE HYCLATE 100 MG PO CAPS: 100.0000 mg | ORAL_CAPSULE | Freq: Two times a day (BID) | ORAL | 0 refills | Status: AC

## 2019-01-25 MED ORDER — CEFTRIAXONE SODIUM 250 MG IJ SOLR
500.0000 mg | Freq: Once | INTRAMUSCULAR | Status: AC
  Administered 2019-01-25: 16:00:00 500 mg via INTRAMUSCULAR

## 2019-01-25 MED ORDER — FLUCONAZOLE 150 MG PO TABS: 150.0000 mg | ORAL_TABLET | Freq: Every day | ORAL | 0 refills | Status: DC

## 2019-01-25 MED ORDER — METRONIDAZOLE 500 MG PO TABS: 500.0000 mg | ORAL_TABLET | Freq: Two times a day (BID) | ORAL | 0 refills | Status: AC
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
## 2019-01-25 NOTE — Discharge Instructions (Addendum)
Very important to take both antibiotics twice daily with food. Recommend taking probiotic and/or eating yogurt to help reduce risk of diarrhea. May take Diflucan 1 week into antibiotic course, and another week if needed for vaginal yeast infection. Testing pending: We will call you if any additional treatment is needed. Go to ER for worsening discharge, bleeding, pain, fever.

## 2019-01-25 NOTE — ED Triage Notes (Signed)
Pt presents to [facility] for assessment of lower abdominal cramping, vaginal discharge.  Last menstrual cycle 12/23, ended on 12/31.  Started noting blood when wiping on 1/3.

## 2019-01-25 NOTE — ED Notes (Signed)
Patient able to ambulate independently  

## 2019-01-30 LAB — CERVICOVAGINAL ANCILLARY ONLY
Bacterial vaginitis: POSITIVE — AB
Chlamydia: NEGATIVE
Neisseria Gonorrhea: NEGATIVE
Trichomonas: NEGATIVE

## 2019-03-07 DIAGNOSIS — R102 Pelvic and perineal pain: Secondary | ICD-10-CM | POA: Diagnosis not present

## 2019-03-07 DIAGNOSIS — Z3202 Encounter for pregnancy test, result negative: Secondary | ICD-10-CM | POA: Diagnosis not present

## 2019-04-02 ENCOUNTER — Other Ambulatory Visit: Payer: Self-pay

## 2019-04-02 ENCOUNTER — Encounter (HOSPITAL_COMMUNITY): Payer: Self-pay | Admitting: Emergency Medicine

## 2019-04-02 ENCOUNTER — Emergency Department (HOSPITAL_COMMUNITY): Payer: Medicaid Other

## 2019-04-02 ENCOUNTER — Emergency Department (HOSPITAL_COMMUNITY)
Admission: EM | Admit: 2019-04-02 | Discharge: 2019-04-02 | Disposition: A | Discharge status: Home / Self Care | Payer: Medicaid Other | Attending: Emergency Medicine | Admitting: Emergency Medicine

## 2019-04-02 DIAGNOSIS — R197 Diarrhea, unspecified: Secondary | ICD-10-CM | POA: Diagnosis not present

## 2019-04-02 DIAGNOSIS — R1031 Right lower quadrant pain: Secondary | ICD-10-CM | POA: Insufficient documentation

## 2019-04-02 DIAGNOSIS — Z20822 Contact with and (suspected) exposure to covid-19: Secondary | ICD-10-CM | POA: Insufficient documentation

## 2019-04-02 DIAGNOSIS — R112 Nausea with vomiting, unspecified: Secondary | ICD-10-CM | POA: Insufficient documentation

## 2019-04-02 DIAGNOSIS — R110 Nausea: Secondary | ICD-10-CM | POA: Diagnosis present

## 2019-04-02 LAB — CBC WITH DIFFERENTIAL/PLATELET
Abs Immature Granulocytes: 0.05 10*3/uL (ref 0.00–0.07)
Basophils Absolute: 0 10*3/uL (ref 0.0–0.1)
Basophils Relative: 0 %
Eosinophils Absolute: 0.1 10*3/uL (ref 0.0–0.5)
Eosinophils Relative: 1 %
HCT: 39.8 % (ref 36.0–46.0)
Hemoglobin: 12.9 g/dL (ref 12.0–15.0)
Immature Granulocytes: 1 %
Lymphocytes Relative: 11 %
Lymphs Abs: 0.9 10*3/uL (ref 0.7–4.0)
MCH: 27.2 pg (ref 26.0–34.0)
MCHC: 32.4 g/dL (ref 30.0–36.0)
MCV: 83.8 fL (ref 80.0–100.0)
Monocytes Absolute: 0.7 10*3/uL (ref 0.1–1.0)
Monocytes Relative: 8 %
Neutro Abs: 6.7 10*3/uL (ref 1.7–7.7)
Neutrophils Relative %: 79 %
Platelets: 277 10*3/uL (ref 150–400)
RBC: 4.75 MIL/uL (ref 3.87–5.11)
RDW: 14.9 % (ref 11.5–15.5)
WBC: 8.5 10*3/uL (ref 4.0–10.5)
nRBC: 0 % (ref 0.0–0.2)

## 2019-04-02 LAB — URINALYSIS, ROUTINE W REFLEX MICROSCOPIC
Bilirubin Urine: NEGATIVE
Glucose, UA: NEGATIVE mg/dL
Hgb urine dipstick: NEGATIVE
Ketones, ur: NEGATIVE mg/dL
Leukocytes,Ua: NEGATIVE
Nitrite: NEGATIVE
Protein, ur: NEGATIVE mg/dL
Specific Gravity, Urine: <1.005 — ABNORMAL LOW (ref 1.005–1.030)
pH: 7 (ref 5.0–8.0)

## 2019-04-02 LAB — COMPREHENSIVE METABOLIC PANEL
ALT: 18 U/L (ref 0–44)
AST: 23 U/L (ref 15–41)
Albumin: 4.3 g/dL (ref 3.5–5.0)
Alkaline Phosphatase: 56 U/L (ref 38–126)
Anion gap: 9 (ref 5–15)
BUN: 9 mg/dL (ref 6–20)
CO2: 26 mmol/L (ref 22–32)
Calcium: 9.4 mg/dL (ref 8.9–10.3)
Chloride: 104 mmol/L (ref 98–111)
Creatinine, Ser: 0.65 mg/dL (ref 0.44–1.00)
GFR calc Af Amer: >60 mL/min (ref >60)
GFR calc non Af Amer: >60 mL/min (ref >60)
Glucose, Bld: 79 mg/dL (ref 70–99)
Potassium: 3.9 mmol/L (ref 3.5–5.1)
Sodium: 139 mmol/L (ref 135–145)
Total Bilirubin: 0.9 mg/dL (ref 0.3–1.2)
Total Protein: 7.5 g/dL (ref 6.5–8.1)

## 2019-04-02 LAB — HCG, QUANTITATIVE, PREGNANCY: hCG, Beta Chain, Quant, S: <1 m[IU]/mL (ref <5)

## 2019-04-02 LAB — SARS CORONAVIRUS 2 (TAT 6-24 HRS): SARS Coronavirus 2: NEGATIVE

## 2019-04-02 LAB — LIPASE, BLOOD: Lipase: 22 U/L (ref 11–51)

## 2019-04-02 MED ORDER — SODIUM CHLORIDE 0.9 % IV BOLUS
1000.0000 mL | Freq: Once | INTRAVENOUS | Status: AC
  Administered 2019-04-02: 1000 mL via INTRAVENOUS

## 2019-04-02 MED ORDER — ONDANSETRON HCL 4 MG/2ML IJ SOLN
4.0000 mg | Freq: Once | INTRAMUSCULAR | Status: AC
  Administered 2019-04-02: 4 mg via INTRAVENOUS
  Filled 2019-04-02: qty 2

## 2019-04-02 MED ORDER — IOHEXOL 300 MG/ML  SOLN
100.0000 mL | Freq: Once | INTRAMUSCULAR | Status: AC | PRN
  Administered 2019-04-02: 100 mL via INTRAVENOUS

## 2019-04-02 MED ORDER — SODIUM CHLORIDE (PF) 0.9 % IJ SOLN
INTRAMUSCULAR | Status: AC
  Filled 2019-04-02: qty 50

## 2019-04-02 MED ORDER — KETOROLAC TROMETHAMINE 30 MG/ML IJ SOLN
15.0000 mg | Freq: Once | INTRAMUSCULAR | Status: AC
  Administered 2019-04-02: 15 mg via INTRAVENOUS
  Filled 2019-04-02: qty 1

## 2019-04-02 MED ORDER — ONDANSETRON 8 MG PO TBDP: 8.0000 mg | ORAL_TABLET | Freq: Three times a day (TID) | ORAL | 0 refills | Status: DC | PRN

## 2019-04-02 MED ORDER — METOCLOPRAMIDE HCL 5 MG/ML IJ SOLN
10.0000 mg | Freq: Once | INTRAMUSCULAR | Status: AC
  Administered 2019-04-02: 10 mg via INTRAVENOUS
  Filled 2019-04-02: qty 2

## 2019-04-02 MED ORDER — DICYCLOMINE HCL 20 MG PO TABS: 20.0000 mg | ORAL_TABLET | Freq: Two times a day (BID) | ORAL | 0 refills | Status: DC
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
## 2019-04-02 NOTE — ED Triage Notes (Signed)
Per pt, states she might have food poisoning from [employer]-also states her lower back hurts due to her "anemia"-states diarrhea and vomiting that started this am

## 2019-04-02 NOTE — Discharge Instructions (Addendum)
Nausea, Vomiting, and Diarrhea  Hand washing: Wash your hands throughout the day, but especially before and after touching the face, using the restroom, sneezing, coughing, or touching surfaces that have been coughed or sneezed upon. Hydration: Symptoms will be intensified and complicated by dehydration. Dehydration can also extend the duration of symptoms. Drink plenty of fluids and get plenty of rest. You should be drinking at least half a liter of water an hour to stay hydrated. Electrolyte drinks (ex. Gatorade, Powerade, Pedialyte) are also encouraged. You should be drinking enough fluids to make your urine light yellow, almost clear. If this is not the case, you are not drinking enough water. Please note that some of the treatments indicated below will not be effective if you are not adequately hydrated. Diet: Please concentrate on hydration, however, you may introduce food slowly.  Start with a clear liquid diet, progressed to a full liquid diet, and then bland solids as you are able. Pain or fever: Ibuprofen, Naproxen, or Tylenol for pain or fever.  Nausea/vomiting: Use the ondansetron (generic for Zofran) for nausea or vomiting.  This medication may not prevent all vomiting or nausea, but can help facilitate better hydration. Things that can help with nausea/vomiting also include peppermint/menthol candies, vitamin B12, and ginger. Diarrhea: May use medications such as loperamide (Imodium) or Bismuth subsalicylate (Pepto-Bismol). Bentyl: This medication is what is known as an antispasmodic and is intended to help reduce abdominal discomfort. Follow-up: Follow-up with a primary care provider on this matter. Return: Return should you develop a fever, bloody diarrhea, increased abdominal pain, uncontrolled vomiting, or any other major concerns.  For prescription assistance, may try using prescription discount sites or apps, such as goodrx.com  Test Results for COVID-19 pending  You have a  test pending for COVID-19.  Results typically return within about 48 hours.  Be sure to check MyChart for updated results.  We recommend isolating yourself until results are received.  Patients who have symptoms consistent with COVID-19 should self isolated for: At least 3 days (72 hours) have passed since recovery, defined as resolution of fever without the use of fever reducing medications and improvement in respiratory symptoms (e.g., cough, shortness of breath), and At least 7 days have passed since symptoms first appeared.  If you have no symptoms, but your test returns positive, recommend isolating for at least 10 days.

## 2019-04-02 NOTE — ED Notes (Signed)
Keshana Klemz and Michelle cleaned up pt due to vomit on the floor & Diarrhea in the bed. Pt had emesis bags near by but didn't use them when felt nausea she used the floor. Trong Gosling and Michelle cleaned up the floor and EVS Mopped behind our work and the bed linens was changed also.

## 2019-05-17 ENCOUNTER — Ambulatory Visit
Admission: EM | Admit: 2019-05-17 | Discharge: 2019-05-17 | Disposition: A | Discharge status: Home / Self Care | Payer: Medicaid Other

## 2019-05-17 ENCOUNTER — Other Ambulatory Visit: Payer: Self-pay

## 2019-05-17 ENCOUNTER — Encounter: Payer: Self-pay | Admitting: Emergency Medicine

## 2019-05-17 DIAGNOSIS — H60501 Unspecified acute noninfective otitis externa, right ear: Secondary | ICD-10-CM | POA: Diagnosis not present

## 2019-05-17 MED ORDER — NEOMYCIN-POLYMYXIN-HC 3.5-10000-1 OT SOLN: 3.0000 [drp] | Freq: Three times a day (TID) | OTIC | 0 refills | Status: AC

## 2019-05-17 NOTE — ED Triage Notes (Signed)
Right ear pain for 1 - 1 1/2 weeks.  Pain in right ear.  Patient reports she can hear ok, patient thinks it is draining Denies cough, cold , and runny nose

## 2019-05-17 NOTE — Discharge Instructions (Signed)
Use eardrops as prescribed for the next week. Return for worsening ear pain, swelling, discharge, bleeding, decreased hearing, development of jaw pain/swelling, fever.  Do NOT use Q-tips as these can cause your ear wax to get stuck, the tips may break off and become a foreign body requiring additional medical care, or puncture your eardrum.  Helpful prevention tip: Use a solution of equal parts isopropyl (rubbing) alcohol and white vinegar (acetic acid) in both ears after swimming. 
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
## 2019-05-30 ENCOUNTER — Emergency Department (HOSPITAL_BASED_OUTPATIENT_CLINIC_OR_DEPARTMENT_OTHER): Payer: Medicaid Other

## 2019-05-30 ENCOUNTER — Other Ambulatory Visit: Payer: Self-pay

## 2019-05-30 ENCOUNTER — Encounter (HOSPITAL_BASED_OUTPATIENT_CLINIC_OR_DEPARTMENT_OTHER): Payer: Self-pay

## 2019-05-30 ENCOUNTER — Emergency Department (HOSPITAL_BASED_OUTPATIENT_CLINIC_OR_DEPARTMENT_OTHER)
Admission: EM | Admit: 2019-05-30 | Discharge: 2019-05-30 | Disposition: A | Discharge status: Home / Self Care | Payer: Medicaid Other | Attending: Emergency Medicine | Admitting: Emergency Medicine

## 2019-05-30 DIAGNOSIS — Y999 Unspecified external cause status: Secondary | ICD-10-CM | POA: Insufficient documentation

## 2019-05-30 DIAGNOSIS — Y9389 Activity, other specified: Secondary | ICD-10-CM | POA: Diagnosis not present

## 2019-05-30 DIAGNOSIS — R42 Dizziness and giddiness: Secondary | ICD-10-CM | POA: Diagnosis not present

## 2019-05-30 DIAGNOSIS — Z888 Allergy status to other drugs, medicaments and biological substances status: Secondary | ICD-10-CM | POA: Diagnosis not present

## 2019-05-30 DIAGNOSIS — Y92410 Unspecified street and highway as the place of occurrence of the external cause: Secondary | ICD-10-CM | POA: Insufficient documentation

## 2019-05-30 DIAGNOSIS — S39012A Strain of muscle, fascia and tendon of lower back, initial encounter: Secondary | ICD-10-CM | POA: Diagnosis not present

## 2019-05-30 DIAGNOSIS — S199XXA Unspecified injury of neck, initial encounter: Secondary | ICD-10-CM | POA: Diagnosis not present

## 2019-05-30 DIAGNOSIS — S161XXA Strain of muscle, fascia and tendon at neck level, initial encounter: Secondary | ICD-10-CM | POA: Insufficient documentation

## 2019-05-30 DIAGNOSIS — S299XXA Unspecified injury of thorax, initial encounter: Secondary | ICD-10-CM | POA: Diagnosis not present

## 2019-05-30 DIAGNOSIS — S0990XA Unspecified injury of head, initial encounter: Secondary | ICD-10-CM | POA: Diagnosis not present

## 2019-05-30 DIAGNOSIS — M545 Low back pain: Secondary | ICD-10-CM | POA: Diagnosis not present

## 2019-05-30 LAB — URINALYSIS, MICROSCOPIC (REFLEX): RBC / HPF: NONE SEEN RBC/hpf (ref 0–5)

## 2019-05-30 LAB — URINALYSIS, ROUTINE W REFLEX MICROSCOPIC
Bilirubin Urine: NEGATIVE
Glucose, UA: NEGATIVE mg/dL
Hgb urine dipstick: NEGATIVE
Ketones, ur: 15 mg/dL — AB
Nitrite: POSITIVE — AB
Protein, ur: NEGATIVE mg/dL
Specific Gravity, Urine: 1.025 (ref 1.005–1.030)
pH: 7 (ref 5.0–8.0)

## 2019-05-30 LAB — PREGNANCY, URINE: Preg Test, Ur: NEGATIVE

## 2019-05-30 MED ORDER — IBUPROFEN 800 MG PO TABS
800.0000 mg | ORAL_TABLET | Freq: Once | ORAL | Status: AC
  Administered 2019-05-30: 800 mg via ORAL
  Filled 2019-05-30: qty 1

## 2019-05-30 MED ORDER — CYCLOBENZAPRINE HCL 5 MG PO TABS
5.0000 mg | ORAL_TABLET | Freq: Once | ORAL | Status: AC
  Administered 2019-05-30: 5 mg via ORAL
  Filled 2019-05-30: qty 1

## 2019-05-30 MED ORDER — CYCLOBENZAPRINE HCL 5 MG PO TABS: 5.0000 mg | ORAL_TABLET | Freq: Three times a day (TID) | ORAL | 0 refills | Status: DC | PRN

## 2019-05-30 MED ORDER — IBUPROFEN 600 MG PO TABS: 600.0000 mg | ORAL_TABLET | Freq: Four times a day (QID) | ORAL | 0 refills | Status: DC | PRN
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
## 2019-05-30 NOTE — Discharge Instructions (Signed)
Expect to be stiff and sore for several days.  Take Motrin and Flexeril for pain and muscle spasms.  Follow-up with your doctor.  Return to ER if you have worse back pain, neck pain, headaches.
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
## 2019-07-14 ENCOUNTER — Ambulatory Visit
Admission: EM | Admit: 2019-07-14 | Discharge: 2019-07-14 | Disposition: A | Discharge status: Home / Self Care | Payer: Medicaid Other | Attending: Physician Assistant | Admitting: Physician Assistant

## 2019-07-14 ENCOUNTER — Other Ambulatory Visit: Payer: Self-pay

## 2019-07-14 DIAGNOSIS — L309 Dermatitis, unspecified: Secondary | ICD-10-CM

## 2019-07-14 MED ORDER — TRIAMCINOLONE ACETONIDE 0.025 % EX OINT: 1.0000 "application " | TOPICAL_OINTMENT | Freq: Two times a day (BID) | CUTANEOUS | 0 refills | Status: DC

## 2019-07-14 NOTE — ED Triage Notes (Signed)
Pt c/o rash to arms and legs x2wks. States out of her triamcinolone cream.
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
## 2019-07-14 NOTE — Discharge Instructions (Addendum)
Restart triamcinolone as directed. Avoid new products. Follow up with PCP for further refills as needed.

## 2019-08-09 ENCOUNTER — Ambulatory Visit
Admission: EM | Admit: 2019-08-09 | Discharge: 2019-08-09 | Disposition: A | Discharge status: Home / Self Care | Payer: Medicaid Other | Attending: Family Medicine | Admitting: Family Medicine

## 2019-08-09 DIAGNOSIS — J029 Acute pharyngitis, unspecified: Secondary | ICD-10-CM | POA: Diagnosis not present

## 2019-08-09 DIAGNOSIS — Z3202 Encounter for pregnancy test, result negative: Secondary | ICD-10-CM

## 2019-08-09 DIAGNOSIS — Z113 Encounter for screening for infections with a predominantly sexual mode of transmission: Secondary | ICD-10-CM

## 2019-08-09 DIAGNOSIS — R0981 Nasal congestion: Secondary | ICD-10-CM | POA: Diagnosis not present

## 2019-08-09 LAB — POCT URINE PREGNANCY: Preg Test, Ur: NEGATIVE

## 2019-08-09 LAB — POCT RAPID STREP A (OFFICE): Rapid Strep A Screen: NEGATIVE

## 2019-08-09 MED ORDER — FLUTICASONE PROPIONATE 50 MCG/ACT NA SUSP: 2.0000 | Freq: Every day | NASAL | 2 refills | Status: DC

## 2019-08-09 MED ORDER — CETIRIZINE-PSEUDOEPHEDRINE ER 5-120 MG PO TB12: 1.0000 | ORAL_TABLET | Freq: Every day | ORAL | 0 refills | Status: DC
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
## 2019-08-09 NOTE — ED Triage Notes (Signed)
Pt c/o cough, congestion and sore throat for over a week. States her son has strep throat. Pt also requesting STD checking.

## 2019-08-09 NOTE — Discharge Instructions (Signed)
Your rapid strep test is negative.  A throat culture is pending; we will call you if it is positive requiring treatment.    Swab testing will take 2 days. If there are any positive results we will be in contact with you and will treat accordingly

## 2019-08-10 ENCOUNTER — Telehealth (HOSPITAL_COMMUNITY): Payer: Self-pay | Admitting: Emergency Medicine

## 2019-08-10 LAB — CERVICOVAGINAL ANCILLARY ONLY
Bacterial Vaginitis (gardnerella): NEGATIVE
Candida Glabrata: NEGATIVE
Candida Vaginitis: NEGATIVE
Chlamydia: POSITIVE — AB
Comment: NEGATIVE
Comment: NEGATIVE
Comment: NEGATIVE
Comment: NEGATIVE
Comment: NEGATIVE
Comment: NORMAL
Neisseria Gonorrhea: NEGATIVE
Trichomonas: NEGATIVE

## 2019-08-10 MED ORDER — AZITHROMYCIN 250 MG PO TABS: 1000.0000 mg | ORAL_TABLET | Freq: Once | ORAL | 0 refills | Status: AC

## 2019-08-10 NOTE — Telephone Encounter (Signed)
Chlamydia is positive.  This was not treated at the [facility] visit and azithromycin 1g was sent to pharmacy  Please refrain from sexual intercourse for 7 days to give the medicine time to work.  Sexual partners need to be notified and tested/treated.  Condoms may reduce risk of reinfection.  Recheck or followup with PCP for further evaluation if symptoms are not improving.  [facility] notified.   Spoke with pt verbalized understanding of results and need for meds

## 2019-09-12 ENCOUNTER — Other Ambulatory Visit: Payer: Self-pay

## 2019-09-12 ENCOUNTER — Ambulatory Visit
Admission: EM | Admit: 2019-09-12 | Discharge: 2019-09-12 | Disposition: A | Discharge status: Home / Self Care | Payer: Medicaid Other | Attending: Emergency Medicine | Admitting: Emergency Medicine

## 2019-09-12 DIAGNOSIS — A749 Chlamydial infection, unspecified: Secondary | ICD-10-CM | POA: Insufficient documentation

## 2019-09-12 DIAGNOSIS — N898 Other specified noninflammatory disorders of vagina: Secondary | ICD-10-CM | POA: Diagnosis not present

## 2019-09-12 LAB — POCT URINE PREGNANCY: Preg Test, Ur: NEGATIVE

## 2019-09-12 MED ORDER — DOXYCYCLINE HYCLATE 100 MG PO CAPS: 100.0000 mg | ORAL_CAPSULE | Freq: Two times a day (BID) | ORAL | 0 refills | Status: AC

## 2019-09-12 NOTE — ED Triage Notes (Signed)
Pt c/o vaginal odor, states she does not believe she took her antibiotics correctly last month

## 2019-09-12 NOTE — Discharge Instructions (Signed)
Today you received treatment for chlamydia. Testing for chlamydia, gonorrhea, trichomonas is pending: please look for these results on the MyChart app/website.  We will notify you if you are positive and outline treatment at that time.  Important to avoid all forms of sexual intercourse (oral, vaginal, anal) with any/all partners for the next 7 days to avoid spreading/reinfecting. Any/all sexual partners should be notified of testing/treatment today.  Return for persistent/worsening symptoms or if you develop fever, abdominal or pelvic pain, discharge, genital pain, blood in your urine, or are re-exposed to an STI. 
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
## 2019-09-14 LAB — CERVICOVAGINAL ANCILLARY ONLY
Bacterial Vaginitis (gardnerella): POSITIVE — AB
Chlamydia: NEGATIVE
Comment: NEGATIVE
Comment: NEGATIVE
Comment: NEGATIVE
Comment: NORMAL
Neisseria Gonorrhea: NEGATIVE
Trichomonas: NEGATIVE

## 2019-09-15 ENCOUNTER — Telehealth: Payer: Self-pay

## 2019-09-15 MED ORDER — METRONIDAZOLE 500 MG PO TABS: 500.0000 mg | ORAL_TABLET | Freq: Two times a day (BID) | ORAL | 0 refills | Status: DC

## 2019-12-19 ENCOUNTER — Ambulatory Visit
Admission: EM | Admit: 2019-12-19 | Discharge: 2019-12-19 | Disposition: A | Discharge status: Home / Self Care | Payer: Medicaid Other | Attending: Emergency Medicine | Admitting: Emergency Medicine

## 2019-12-19 DIAGNOSIS — N898 Other specified noninflammatory disorders of vagina: Secondary | ICD-10-CM | POA: Diagnosis not present

## 2019-12-19 DIAGNOSIS — Z3A01 Less than 8 weeks gestation of pregnancy: Secondary | ICD-10-CM | POA: Insufficient documentation

## 2019-12-19 LAB — POCT URINE PREGNANCY: Preg Test, Ur: POSITIVE — AB
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
## 2019-12-19 NOTE — Discharge Instructions (Signed)

## 2019-12-19 NOTE — ED Triage Notes (Signed)
Pt c/o abscess to rt side for over a month, dried up now but wants some it check. Pt also requesting STD testing. States has had a white discharge with odor since her last cycle 11/23.

## 2019-12-20 LAB — CERVICOVAGINAL ANCILLARY ONLY
Chlamydia: NEGATIVE
Comment: NEGATIVE
Comment: NEGATIVE
Comment: NORMAL
Neisseria Gonorrhea: NEGATIVE
Trichomonas: NEGATIVE

## 2020-01-31 DIAGNOSIS — O039 Complete or unspecified spontaneous abortion without complication: Secondary | ICD-10-CM | POA: Diagnosis not present

## 2020-01-31 DIAGNOSIS — O23599 Infection of other part of genital tract in pregnancy, unspecified trimester: Secondary | ICD-10-CM | POA: Diagnosis not present

## 2020-01-31 DIAGNOSIS — O034 Incomplete spontaneous abortion without complication: Secondary | ICD-10-CM | POA: Diagnosis not present

## 2020-01-31 DIAGNOSIS — N760 Acute vaginitis: Secondary | ICD-10-CM | POA: Diagnosis not present

## 2020-01-31 DIAGNOSIS — O2340 Unspecified infection of urinary tract in pregnancy, unspecified trimester: Secondary | ICD-10-CM | POA: Diagnosis not present

## 2020-01-31 DIAGNOSIS — O208 Other hemorrhage in early pregnancy: Secondary | ICD-10-CM | POA: Diagnosis not present

## 2020-01-31 DIAGNOSIS — N390 Urinary tract infection, site not specified: Secondary | ICD-10-CM | POA: Diagnosis not present

## 2020-01-31 DIAGNOSIS — Z3201 Encounter for pregnancy test, result positive: Secondary | ICD-10-CM | POA: Diagnosis not present

## 2020-01-31 DIAGNOSIS — Z3A00 Weeks of gestation of pregnancy not specified: Secondary | ICD-10-CM | POA: Diagnosis not present

## 2020-02-01 DIAGNOSIS — O034 Incomplete spontaneous abortion without complication: Secondary | ICD-10-CM | POA: Diagnosis not present

## 2020-02-09 DIAGNOSIS — O034 Incomplete spontaneous abortion without complication: Secondary | ICD-10-CM | POA: Diagnosis not present

## 2020-02-28 DIAGNOSIS — O034 Incomplete spontaneous abortion without complication: Secondary | ICD-10-CM | POA: Diagnosis not present

## 2020-02-28 DIAGNOSIS — Z01818 Encounter for other preprocedural examination: Secondary | ICD-10-CM | POA: Diagnosis not present

## 2020-02-29 DIAGNOSIS — Z3A00 Weeks of gestation of pregnancy not specified: Secondary | ICD-10-CM | POA: Diagnosis not present

## 2020-02-29 DIAGNOSIS — O034 Incomplete spontaneous abortion without complication: Secondary | ICD-10-CM | POA: Diagnosis not present

## 2020-02-29 DIAGNOSIS — O029 Abnormal product of conception, unspecified: Secondary | ICD-10-CM | POA: Diagnosis not present

## 2020-04-16 DIAGNOSIS — Z20822 Contact with and (suspected) exposure to covid-19: Secondary | ICD-10-CM | POA: Diagnosis not present

## 2020-06-19 ENCOUNTER — Other Ambulatory Visit: Payer: Self-pay

## 2020-06-19 ENCOUNTER — Emergency Department (HOSPITAL_BASED_OUTPATIENT_CLINIC_OR_DEPARTMENT_OTHER)
Admission: EM | Admit: 2020-06-19 | Discharge: 2020-06-19 | Disposition: A | Discharge status: Home / Self Care | Payer: Medicaid Other | Attending: Emergency Medicine | Admitting: Emergency Medicine

## 2020-06-19 ENCOUNTER — Encounter (HOSPITAL_BASED_OUTPATIENT_CLINIC_OR_DEPARTMENT_OTHER): Payer: Self-pay | Admitting: Emergency Medicine

## 2020-06-19 DIAGNOSIS — J020 Streptococcal pharyngitis: Secondary | ICD-10-CM | POA: Diagnosis not present

## 2020-06-19 DIAGNOSIS — Z20822 Contact with and (suspected) exposure to covid-19: Secondary | ICD-10-CM | POA: Diagnosis not present

## 2020-06-19 LAB — SARS CORONAVIRUS 2 (TAT 6-24 HRS): SARS Coronavirus 2: NEGATIVE

## 2020-06-19 LAB — GROUP A STREP BY PCR: Group A Strep by PCR: DETECTED — AB

## 2020-06-19 MED ORDER — PENICILLIN G BENZATHINE 1200000 UNIT/2ML IM SUSY
1.2000 10*6.[IU] | PREFILLED_SYRINGE | Freq: Once | INTRAMUSCULAR | Status: DC
  Filled 2020-06-19: qty 2

## 2020-06-19 MED ORDER — AMOXICILLIN 500 MG PO CAPS
1000.0000 mg | ORAL_CAPSULE | Freq: Once | ORAL | Status: AC
  Administered 2020-06-19: 1000 mg via ORAL
  Filled 2020-06-19: qty 2

## 2020-06-19 MED ORDER — FLUCONAZOLE 150 MG PO TABS: ORAL_TABLET | ORAL | 0 refills | Status: DC

## 2020-06-19 MED ORDER — AMOXICILLIN 500 MG PO CAPS: ORAL_CAPSULE | ORAL | 0 refills | Status: DC

## 2020-06-19 NOTE — ED Triage Notes (Signed)
Pt c/o sore throat with painful swallowing x3 days along with chills, fever, and body aches.  Negative at home COVID yesterday.
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
## 2020-06-20 ENCOUNTER — Telehealth: Payer: Self-pay

## 2020-06-20 NOTE — Telephone Encounter (Signed)
Transition Care Management Follow-up Telephone Call  Date of discharge and from where: 06/19/2020 from [facility]  How have you been since you were released from the hospital? Pt stated that she is feeling better and has no questions or concerns.   Any questions or concerns? No  Items Reviewed:  Did the pt receive and understand the discharge instructions provided? Yes   Medications obtained and verified? Yes   Other? No   Any new allergies since your discharge? No   Dietary orders reviewed? n/a  Do you have support at home? Yes   Functional Questionnaire: (I = Independent and D = Dependent) ADLs: I  Bathing/Dressing- I  Meal Prep- I  Eating- I  Maintaining continence- I  Transferring/Ambulation- I  Managing Meds- I   Follow up appointments reviewed:   PCP Hospital f/u appt confirmed? No    Specialist Hospital f/u appt confirmed? No    Are transportation arrangements needed? No   If their condition worsens, is the pt aware to call PCP or go to the Emergency Dept.? Yes  Was the patient provided with contact information for the PCP's office or ED? Yes  Was to pt encouraged to call back with questions or concerns? Yes

## 2020-06-21 ENCOUNTER — Telehealth (HOSPITAL_BASED_OUTPATIENT_CLINIC_OR_DEPARTMENT_OTHER): Payer: Self-pay | Admitting: Emergency Medicine

## 2020-06-21 ENCOUNTER — Telehealth (HOSPITAL_BASED_OUTPATIENT_CLINIC_OR_DEPARTMENT_OTHER): Payer: Self-pay | Admitting: *Deleted

## 2020-06-21 MED ORDER — FLUCONAZOLE 150 MG PO TABS: ORAL_TABLET | ORAL | 0 refills | Status: DC

## 2020-06-21 NOTE — Telephone Encounter (Signed)
Called in additional dose of fluconazole for patient.  Status post antibiotics and typically gets yeast infections after antibiotics.

## 2020-08-08 DIAGNOSIS — L309 Dermatitis, unspecified: Secondary | ICD-10-CM | POA: Diagnosis not present

## 2020-08-08 DIAGNOSIS — L2089 Other atopic dermatitis: Secondary | ICD-10-CM | POA: Diagnosis not present

## 2020-08-13 DIAGNOSIS — Z20822 Contact with and (suspected) exposure to covid-19: Secondary | ICD-10-CM | POA: Diagnosis not present

## 2020-08-13 DIAGNOSIS — U071 COVID-19: Secondary | ICD-10-CM | POA: Diagnosis not present

## 2020-09-08 ENCOUNTER — Ambulatory Visit
Admission: EM | Admit: 2020-09-08 | Discharge: 2020-09-08 | Disposition: A | Discharge status: Home / Self Care | Payer: Medicaid Other | Attending: Urgent Care | Admitting: Urgent Care

## 2020-09-08 ENCOUNTER — Encounter: Payer: Self-pay | Admitting: Emergency Medicine

## 2020-09-08 ENCOUNTER — Other Ambulatory Visit: Payer: Self-pay

## 2020-09-08 DIAGNOSIS — N760 Acute vaginitis: Secondary | ICD-10-CM | POA: Insufficient documentation

## 2020-09-08 LAB — POCT URINE PREGNANCY: Preg Test, Ur: NEGATIVE
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
## 2020-09-08 NOTE — ED Triage Notes (Signed)
Pt sts new partner and having some discharge and irritation; denies dysuria

## 2020-09-09 ENCOUNTER — Telehealth: Payer: Self-pay | Admitting: Emergency Medicine

## 2020-09-09 LAB — CERVICOVAGINAL ANCILLARY ONLY
Bacterial Vaginitis (gardnerella): NEGATIVE
Candida Glabrata: NEGATIVE
Candida Vaginitis: POSITIVE — AB
Chlamydia: NEGATIVE
Comment: NEGATIVE
Comment: NEGATIVE
Comment: NEGATIVE
Comment: NEGATIVE
Comment: NEGATIVE
Comment: NORMAL
Neisseria Gonorrhea: NEGATIVE
Trichomonas: NEGATIVE

## 2020-09-09 MED ORDER — FLUCONAZOLE 150 MG PO TABS: 150.0000 mg | ORAL_TABLET | Freq: Every day | ORAL | 0 refills | Status: AC

## 2020-09-09 NOTE — Telephone Encounter (Signed)
Spoke to pt given positive yeast infection results; meds sent to pharmacy

## 2020-09-10 ENCOUNTER — Telehealth (HOSPITAL_COMMUNITY): Payer: Self-pay | Admitting: Emergency Medicine

## 2020-09-10 NOTE — Telephone Encounter (Signed)
Opened in error

## 2020-10-21 DIAGNOSIS — R059 Cough, unspecified: Secondary | ICD-10-CM | POA: Diagnosis not present

## 2020-10-21 DIAGNOSIS — J029 Acute pharyngitis, unspecified: Secondary | ICD-10-CM | POA: Diagnosis not present

## 2020-10-21 DIAGNOSIS — J069 Acute upper respiratory infection, unspecified: Secondary | ICD-10-CM | POA: Diagnosis not present

## 2020-10-27 DIAGNOSIS — S41159A Open bite of unspecified upper arm, initial encounter: Secondary | ICD-10-CM | POA: Diagnosis not present

## 2020-10-27 DIAGNOSIS — T7491XA Unspecified adult maltreatment, confirmed, initial encounter: Secondary | ICD-10-CM | POA: Diagnosis not present

## 2020-10-27 DIAGNOSIS — W503XXA Accidental bite by another person, initial encounter: Secondary | ICD-10-CM | POA: Diagnosis not present

## 2020-11-04 DIAGNOSIS — S41159A Open bite of unspecified upper arm, initial encounter: Secondary | ICD-10-CM | POA: Diagnosis not present

## 2020-11-04 DIAGNOSIS — W503XXA Accidental bite by another person, initial encounter: Secondary | ICD-10-CM | POA: Diagnosis not present

## 2020-11-26 DIAGNOSIS — B9689 Other specified bacterial agents as the cause of diseases classified elsewhere: Secondary | ICD-10-CM | POA: Diagnosis not present

## 2020-11-26 DIAGNOSIS — N898 Other specified noninflammatory disorders of vagina: Secondary | ICD-10-CM | POA: Diagnosis not present

## 2020-11-26 DIAGNOSIS — N760 Acute vaginitis: Secondary | ICD-10-CM | POA: Diagnosis not present

## 2020-11-26 DIAGNOSIS — Z7251 High risk heterosexual behavior: Secondary | ICD-10-CM | POA: Diagnosis not present

## 2021-02-14 ENCOUNTER — Ambulatory Visit (INDEPENDENT_AMBULATORY_CARE_PROVIDER_SITE_OTHER): Payer: Medicaid Other | Admitting: Obstetrics and Gynecology

## 2021-02-14 ENCOUNTER — Other Ambulatory Visit: Payer: Self-pay

## 2021-02-14 ENCOUNTER — Other Ambulatory Visit (HOSPITAL_COMMUNITY)
Admission: RE | Admit: 2021-02-14 | Discharge: 2021-02-14 | Disposition: A | Discharge status: Home / Self Care | Payer: Medicaid Other | Source: Ambulatory Visit | Attending: Obstetrics and Gynecology | Admitting: Obstetrics and Gynecology

## 2021-02-14 ENCOUNTER — Encounter: Payer: Self-pay | Admitting: Obstetrics and Gynecology

## 2021-02-14 VITALS — BP 111/71 | HR 66 | Wt 162.0 lb

## 2021-02-14 DIAGNOSIS — R5383 Other fatigue: Secondary | ICD-10-CM

## 2021-02-14 DIAGNOSIS — Z01419 Encounter for gynecological examination (general) (routine) without abnormal findings: Secondary | ICD-10-CM
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
## 2021-02-15 LAB — CBC
Hematocrit: 40.7 % (ref 34.0–46.6)
Hemoglobin: 13.7 g/dL (ref 11.1–15.9)
MCH: 26.8 pg (ref 26.6–33.0)
MCHC: 33.7 g/dL (ref 31.5–35.7)
MCV: 80 fL (ref 79–97)
Platelets: 339 10*3/uL (ref 150–450)
RBC: 5.12 x10E6/uL (ref 3.77–5.28)
RDW: 14.1 % (ref 11.7–15.4)
WBC: 4.8 10*3/uL (ref 3.4–10.8)

## 2021-02-15 LAB — RPR: RPR Ser Ql: NONREACTIVE

## 2021-02-15 LAB — HEPATITIS C ANTIBODY: Hep C Virus Ab: <0.1 s/co ratio (ref 0.0–0.9)

## 2021-02-15 LAB — TSH: TSH: 0.858 u[IU]/mL (ref 0.450–4.500)

## 2021-02-15 LAB — HIV ANTIBODY (ROUTINE TESTING W REFLEX): HIV Screen 4th Generation wRfx: NONREACTIVE

## 2021-02-15 LAB — HEPATITIS B SURFACE ANTIGEN: Hepatitis B Surface Ag: NEGATIVE

## 2021-02-17 LAB — CYTOLOGY - PAP: Diagnosis: NEGATIVE

## 2021-02-17 LAB — CERVICOVAGINAL ANCILLARY ONLY
Chlamydia: NEGATIVE
Comment: NEGATIVE
Comment: NORMAL
Neisseria Gonorrhea: NEGATIVE

## 2021-02-27 ENCOUNTER — Telehealth: Payer: Self-pay

## 2021-02-27 DIAGNOSIS — B9689 Other specified bacterial agents as the cause of diseases classified elsewhere: Secondary | ICD-10-CM

## 2021-02-27 DIAGNOSIS — N760 Acute vaginitis: Secondary | ICD-10-CM

## 2021-02-27 MED ORDER — METRONIDAZOLE 500 MG PO TABS: 500.0000 mg | ORAL_TABLET | Freq: Two times a day (BID) | ORAL | 0 refills | Status: DC

## 2021-02-27 NOTE — Telephone Encounter (Signed)
Pt called stating she has BV. Pt states she is having some discharge with irritation. Flagyl 500 mg 1 tablet BID x 7 days was sent to her pharmacy. Understanding was voiced. Tilley Faeth l Jaxson Keener, CMA

## 2021-03-15 IMAGING — CT CT ABD-PELV W/ CM
2 of 4 series · 15 of 46 positions shown, 17 images · IV contrast (APPLIED)
Comparison: None.

CLINICAL DATA: Right lower quadrant tenderness.

EXAM:
CT ABDOMEN AND PELVIS WITH CONTRAST
TECHNIQUE: Multidetector CT imaging of the abdomen and pelvis was performed
using the standard protocol following bolus administration of
intravenous contrast.
CONTRAST:  100mL OMNIPAQUE IOHEXOL 300 MG/ML  SOLN

[Series 2: axial st · axial · 0.58mm/px · z∈[-447,-72]mm · 12 of 85 slices shown, 14 images]
[im 5/85  soft-tissue]
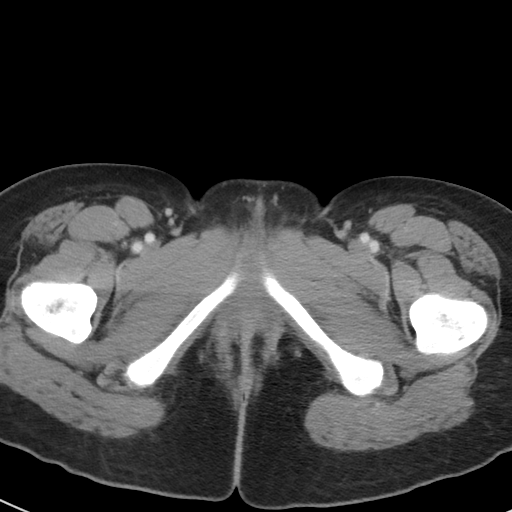
[im 5/85  bone]
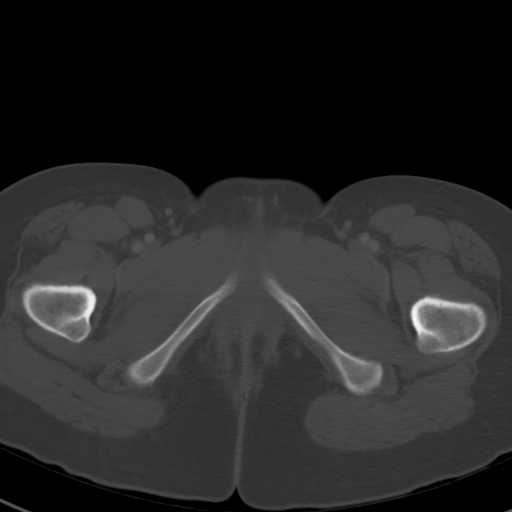
[im 14/85  soft-tissue]
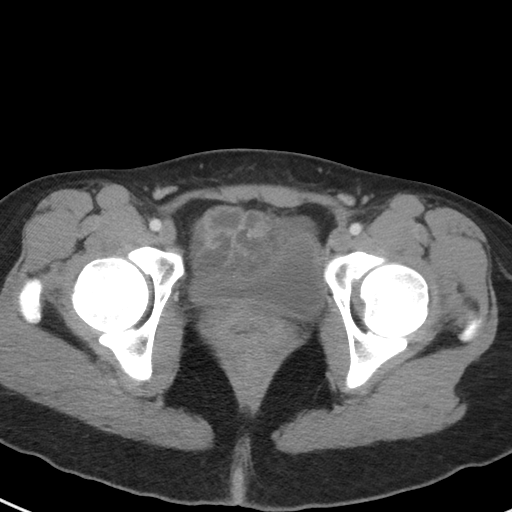
[im 18/85  soft-tissue]
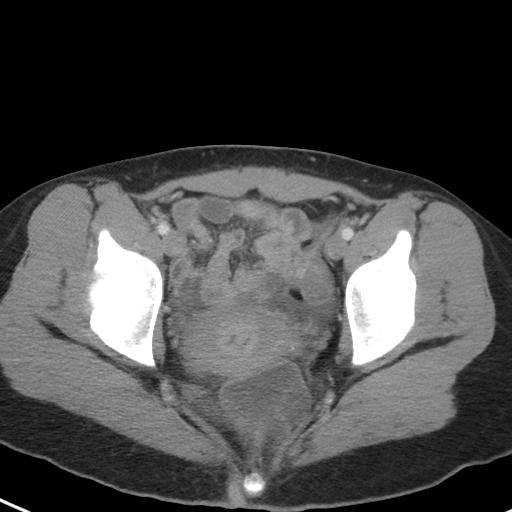
[im 27/85  soft-tissue]
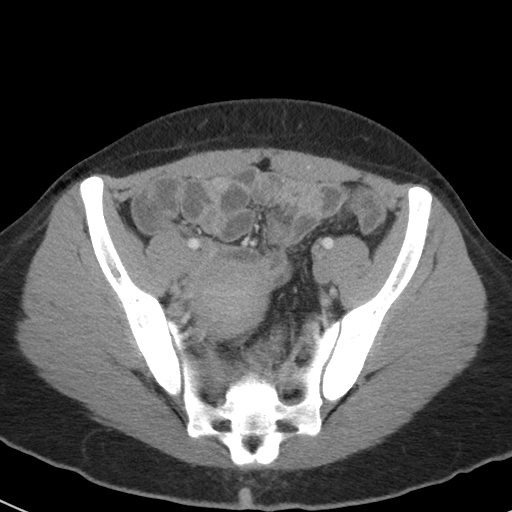
[im 31/85  soft-tissue]
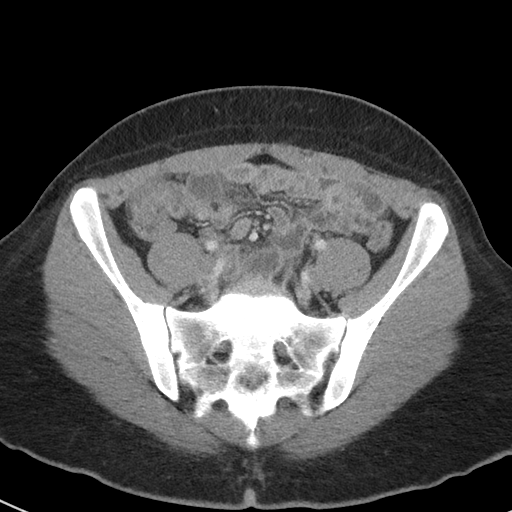
[im 40/85  soft-tissue]
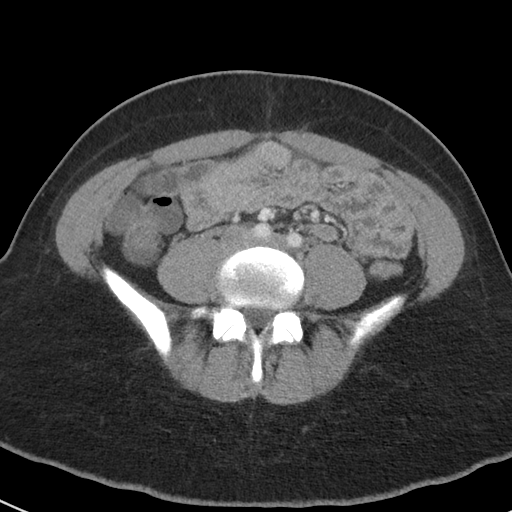
[im 45/85  soft-tissue]
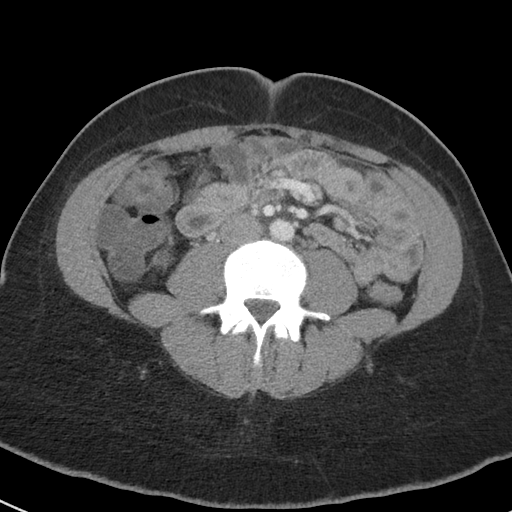
[im 54/85  soft-tissue]
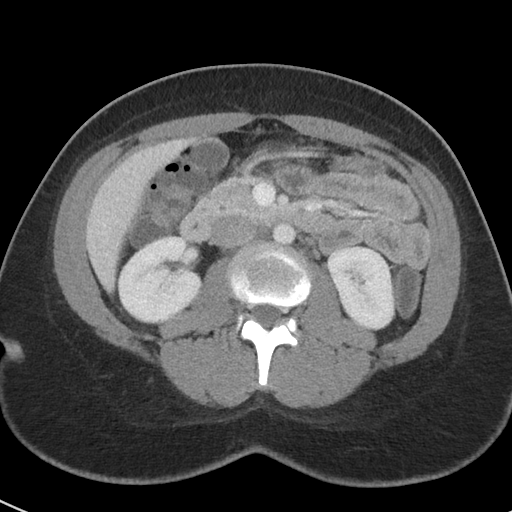
[im 58/85  soft-tissue]
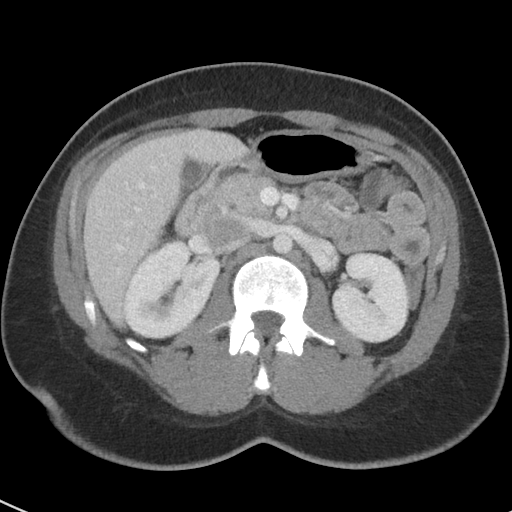
[im 58/85  bone]
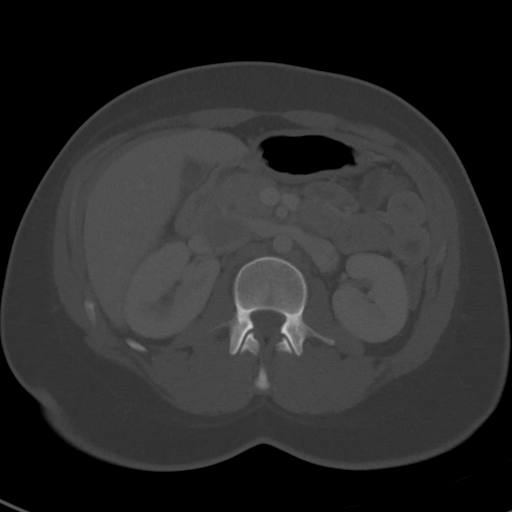
[im 67/85  soft-tissue]
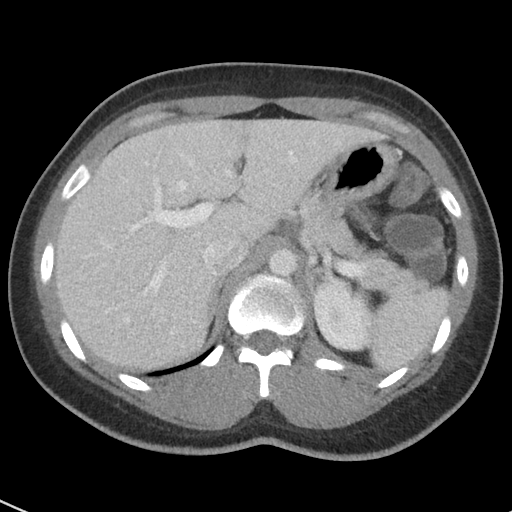
[im 71/85  soft-tissue]
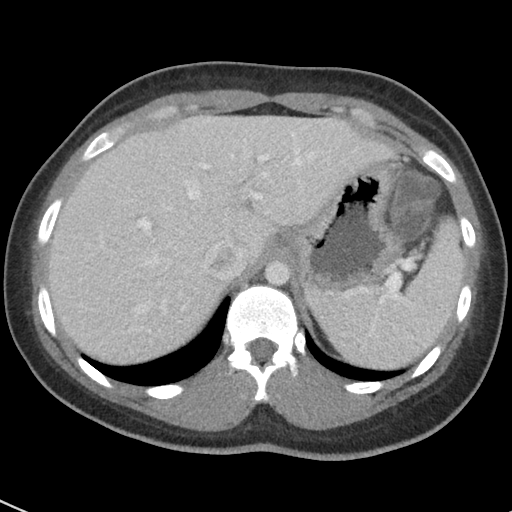
[im 80/85  soft-tissue]
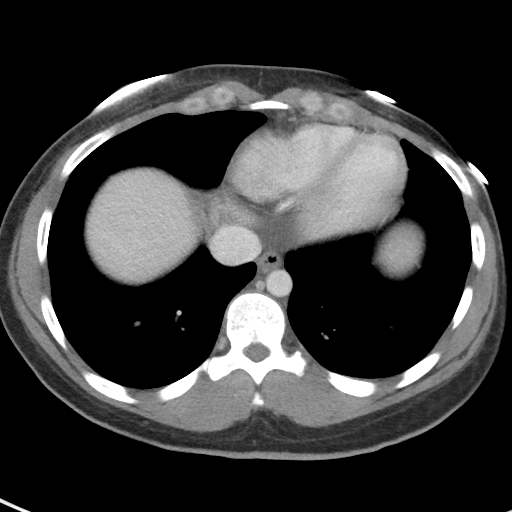

[Series 5: coronal st · coronal · 0.58mm/px · 3 of 76 slices shown]
[im 26/76  soft-tissue]
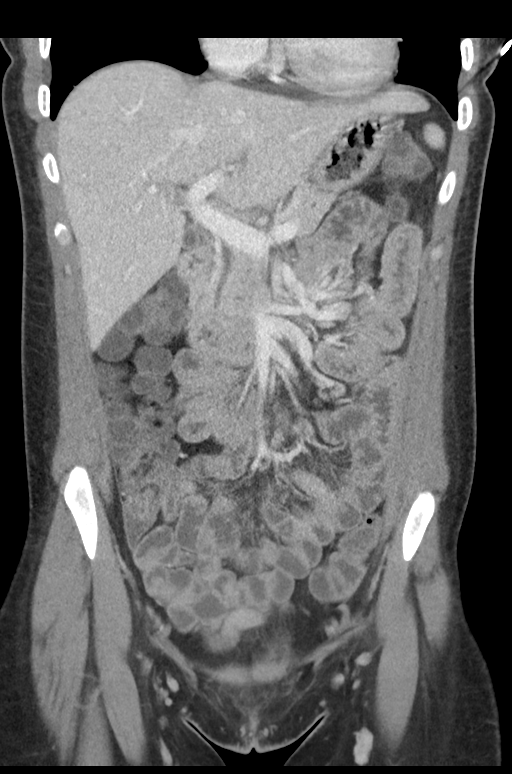
[im 34/76  soft-tissue]
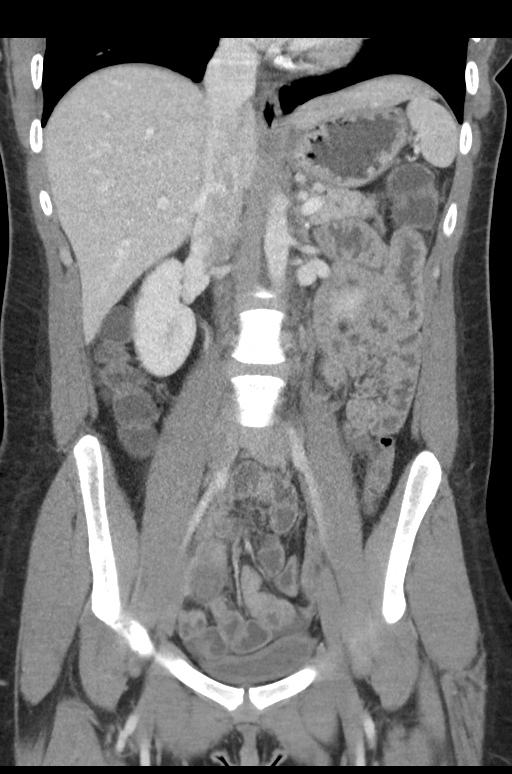
[im 42/76  soft-tissue]
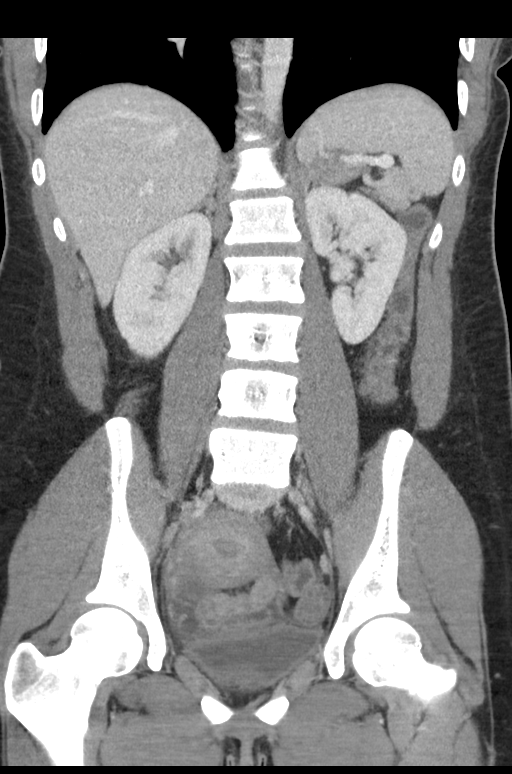

[15 of 46 positions shown; findings below may reference images not displayed]

FINDINGS: Lower chest: No acute abnormality.

Hepatobiliary: No focal liver abnormality is seen. No gallstones,
gallbladder wall thickening, or biliary dilatation.

Pancreas: Unremarkable. No pancreatic ductal dilatation or
surrounding inflammatory changes.

Spleen: Normal in size without focal abnormality.

Adrenals/Urinary Tract: Adrenal glands are unremarkable. Kidneys are
normal, without renal calculi, focal lesion, or hydronephrosis.
Bladder is unremarkable.

Stomach/Bowel: Stomach is within normal limits. Appendix appears
normal. No evidence of bowel wall thickening, distention, or
inflammatory changes.

Vascular/Lymphatic: No significant vascular findings are present. No
enlarged abdominal or pelvic lymph nodes.

Reproductive: Uterus is normal in appearance. A 2.4 cm x 1.9 cm x
2.1 cm cystic appearing areas seen along the anterior aspect of the
right adnexa (axial CT image 65, CT series number 2).

Other: No abdominal wall hernia or abnormality. No abdominopelvic
ascites.

Musculoskeletal: No acute or significant osseous findings.
IMPRESSION: 1. Right adnexal cystic appearing structure measuring 2.4 cm x
cm x 2.1 cm, likely representing a right ovarian cyst.

## 2021-03-27 ENCOUNTER — Emergency Department (HOSPITAL_BASED_OUTPATIENT_CLINIC_OR_DEPARTMENT_OTHER)
Admission: EM | Admit: 2021-03-27 | Discharge: 2021-03-27 | Disposition: A | Discharge status: Home / Self Care | Payer: Medicaid Other | Attending: Emergency Medicine | Admitting: Emergency Medicine

## 2021-03-27 ENCOUNTER — Other Ambulatory Visit: Payer: Self-pay

## 2021-03-27 ENCOUNTER — Encounter (HOSPITAL_BASED_OUTPATIENT_CLINIC_OR_DEPARTMENT_OTHER): Payer: Self-pay

## 2021-03-27 DIAGNOSIS — S99921A Unspecified injury of right foot, initial encounter: Secondary | ICD-10-CM | POA: Diagnosis present

## 2021-03-27 DIAGNOSIS — S91101A Unspecified open wound of right great toe without damage to nail, initial encounter: Secondary | ICD-10-CM | POA: Diagnosis not present

## 2021-03-27 DIAGNOSIS — S91201A Unspecified open wound of right great toe with damage to nail, initial encounter: Secondary | ICD-10-CM | POA: Diagnosis not present

## 2021-03-27 DIAGNOSIS — T148XXA Other injury of unspecified body region, initial encounter: Secondary | ICD-10-CM

## 2021-03-27 MED ORDER — LIDOCAINE HCL (PF) 1 % IJ SOLN: 5.0000 mL | Freq: Once | INTRAMUSCULAR | Status: DC

## 2021-03-27 NOTE — Discharge Instructions (Signed)
Overall recommend bacitracin or Neosporin ointment twice a day to your wound and cover.  Suspect that this should heal well on its own.  Recommend gentle soap and water to the area twice a day as well. ?

## 2021-03-27 NOTE — ED Notes (Signed)
Wound cleaned and dressed at this time. Patient given dressing supplies for home.  ?No acute distress noted upon this RN's departure of patient. Verified discharge paperwork with name and DOB. Vital signs stable. Discharge paperwork discussed with patient. No further questions voiced upon discharge.  ? ? ?
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
## 2021-03-28 ENCOUNTER — Telehealth: Payer: Self-pay

## 2021-03-28 NOTE — Telephone Encounter (Signed)
Transition Care Management Follow-up Telephone Call ?Date of discharge and from where: 03/27/2021 from [facility] ?How have you been since you were released from the hospital? Patient stated that she is feeing much better and did not have any questions or concerns at this time.  ?Any questions or concerns? No ? ?Items Reviewed: ?Did the pt receive and understand the discharge instructions provided? Yes  ?Medications obtained and verified? Yes  ?Other? No  ?Any new allergies since your discharge? No  ?Dietary orders reviewed? No ?Do you have support at home? Yes  ? ?Functional Questionnaire: (I = Independent and D = Dependent) ?ADLs: I ? ?Bathing/Dressing- I ? ?Meal Prep- I ? ?Eating- I ? ?Maintaining continence- I ? ?Transferring/Ambulation- I ? ?Managing Meds- I ? ? ?Follow up appointments reviewed: ? ?PCP Hospital f/u appt confirmed? No  Not establishing at this time.  ?Specialist Hospital f/u appt confirmed? No   ?Are transportation arrangements needed? No  ?If their condition worsens, is the pt aware to call PCP or go to the Emergency Dept.? Yes ?Was the patient provided with contact information for the PCP's office or ED? Yes ?Was to pt encouraged to call back with questions or concerns? Yes ? ?

## 2021-05-01 ENCOUNTER — Ambulatory Visit (INDEPENDENT_AMBULATORY_CARE_PROVIDER_SITE_OTHER): Payer: Medicaid Other | Admitting: Obstetrics & Gynecology

## 2021-05-01 ENCOUNTER — Encounter: Payer: Self-pay | Admitting: Obstetrics & Gynecology

## 2021-05-01 ENCOUNTER — Other Ambulatory Visit (HOSPITAL_COMMUNITY)
Admission: RE | Admit: 2021-05-01 | Discharge: 2021-05-01 | Disposition: A | Discharge status: Home / Self Care | Payer: Medicaid Other | Source: Ambulatory Visit | Attending: Obstetrics & Gynecology | Admitting: Obstetrics & Gynecology

## 2021-05-01 VITALS — BP 120/73 | HR 89 | Wt 160.0 lb

## 2021-05-01 DIAGNOSIS — N898 Other specified noninflammatory disorders of vagina: Secondary | ICD-10-CM | POA: Insufficient documentation

## 2021-05-01 DIAGNOSIS — N760 Acute vaginitis: Secondary | ICD-10-CM

## 2021-05-01 DIAGNOSIS — Z113 Encounter for screening for infections with a predominantly sexual mode of transmission: Secondary | ICD-10-CM
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
## 2021-05-01 NOTE — Patient Instructions (Signed)
Research Hylafem to help with vaginitis and Lactobacillus containing foods. ?

## 2021-05-05 LAB — CERVICOVAGINAL ANCILLARY ONLY
Bacterial Vaginitis (gardnerella): POSITIVE — AB
Candida Glabrata: NEGATIVE
Candida Vaginitis: NEGATIVE
Chlamydia: NEGATIVE
Comment: NEGATIVE
Comment: NEGATIVE
Comment: NEGATIVE
Comment: NEGATIVE
Comment: NEGATIVE
Comment: NORMAL
Neisseria Gonorrhea: NEGATIVE
Trichomonas: NEGATIVE

## 2021-05-05 MED ORDER — METRONIDAZOLE 0.75 % VA GEL: 1.0000 | Freq: Every day | VAGINAL | 5 refills | Status: DC

## 2021-05-05 NOTE — Addendum Note (Signed)
Addended by: Jaynie Collins A on: 05/05/2021 11:40 AM ? ? Modules accepted: Orders ? ?

## 2021-05-07 ENCOUNTER — Other Ambulatory Visit: Payer: Self-pay

## 2021-05-07 MED ORDER — METRONIDAZOLE 500 MG PO TABS: 500.0000 mg | ORAL_TABLET | Freq: Two times a day (BID) | ORAL | 0 refills | Status: DC

## 2021-05-07 NOTE — Progress Notes (Signed)
Patient called requesting a pill for her bacterial vaginosis. Verified pharmacy and sent in flagyl. Armandina Stammer RN  ?
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
## 2021-05-14 ENCOUNTER — Encounter (HOSPITAL_COMMUNITY): Payer: Self-pay

## 2021-05-14 ENCOUNTER — Emergency Department (HOSPITAL_COMMUNITY)
Admission: EM | Admit: 2021-05-14 | Discharge: 2021-05-14 | Disposition: A | Discharge status: Home / Self Care | Payer: Medicaid Other | Attending: Emergency Medicine | Admitting: Emergency Medicine

## 2021-05-14 DIAGNOSIS — J029 Acute pharyngitis, unspecified: Secondary | ICD-10-CM

## 2021-05-14 DIAGNOSIS — J069 Acute upper respiratory infection, unspecified: Secondary | ICD-10-CM | POA: Diagnosis not present

## 2021-05-14 DIAGNOSIS — R0981 Nasal congestion: Secondary | ICD-10-CM | POA: Diagnosis present

## 2021-05-14 LAB — GROUP A STREP BY PCR: Group A Strep by PCR: NOT DETECTED

## 2021-05-14 NOTE — Discharge Instructions (Addendum)
Use Tylenol and ibuprofen every 6 as needed for body aches pain or fever. ?Stay well-hydrated.  Return for increased work of breathing or new concerns. ?

## 2021-05-14 NOTE — ED Provider Notes (Signed)
?[facility] ?Provider Note ? ? ?CSN: [id number] ?Arrival date & time: 05/14/21  0940 ? ?  ? ?History ? ?Chief Complaint  ?Patient presents with  ? Nasal Congestion  ? Headache  ? ? ?Tammy Cruz is a 26 y.o. female. ? ?Patient presents with nasal congestion, frontal headache, mild cough since yesterday.  Patient's child with similar symptoms.  No fevers.  Tolerating oral liquids.  Mild sore throat. ? ? ?  ? ?Home Medications ?Prior to Admission medications   ?Medication Sig Start Date End Date Taking? Authorizing Provider  ?amoxicillin (AMOXIL) 500 MG capsule Take 2 capsules daily starting tomorrow, 06/20/2020. ?Patient not taking: Reported on 09/08/2020 06/19/20   Molpus, John, MD  ?fluconazole (DIFLUCAN) 150 MG tablet Take 1 tablet as needed for vaginal yeast infection.  May repeat in 3 days if symptoms persist. ?Patient not taking: Reported on 09/08/2020 06/21/20   Lennice Sites, DO  ?metroNIDAZOLE (FLAGYL) 500 MG tablet Take 1 tablet (500 mg total) by mouth 2 (two) times daily. 05/07/21   Anyanwu, Sallyanne Havers, MD  ?metroNIDAZOLE (METROGEL) 0.75 % vaginal gel Place 1 Applicatorful vaginally at bedtime. Apply one applicatorful to vagina at bedtime for 10 days, then twice a week for 6 months. 05/05/21   Anyanwu, Sallyanne Havers, MD  ?dicyclomine (BENTYL) 20 MG tablet Take 1 tablet (20 mg total) by mouth 2 (two) times daily. 04/02/19 05/17/19  Joy, Helane Gunther, PA-C  ?   ? ?Allergies    ?Protopic [tacrolimus]   ? ?Review of Systems   ?Review of Systems  ?Constitutional:  Negative for chills and fever.  ?HENT:  Positive for congestion and sore throat.   ?Eyes:  Negative for visual disturbance.  ?Respiratory:  Positive for cough. Negative for shortness of breath.   ?Cardiovascular:  Negative for chest pain.  ?Gastrointestinal:  Negative for abdominal pain and vomiting.  ?Genitourinary:  Negative for dysuria and flank pain.  ?Musculoskeletal:  Negative for back pain, neck pain and neck stiffness.   ?Skin:  Negative for rash.  ?Neurological:  Negative for light-headedness and headaches.  ? ?Physical Exam ?Updated Vital Signs ?Wt 72.6 kg   BMI 28.34 kg/m?  ?Physical Exam ?Vitals and nursing note reviewed.  ?Constitutional:   ?   General: She is not in acute distress. ?   Appearance: She is well-developed.  ?HENT:  ?   Head: Normocephalic and atraumatic.  ?   Comments: Posterior pharyngeal erythema no signs of abscess.  Mild anterior cervical adenopathy.  Neck supple no meningismus. ?   Mouth/Throat:  ?   Mouth: Mucous membranes are moist.  ?Eyes:  ?   General:     ?   Right eye: No discharge.     ?   Left eye: No discharge.  ?   Conjunctiva/sclera: Conjunctivae normal.  ?Neck:  ?   Trachea: No tracheal deviation.  ?Cardiovascular:  ?   Rate and Rhythm: Normal rate and regular rhythm.  ?   Heart sounds: No murmur heard. ?Pulmonary:  ?   Effort: Pulmonary effort is normal.  ?   Breath sounds: Normal breath sounds.  ?Abdominal:  ?   General: There is no distension.  ?   Palpations: Abdomen is soft.  ?   Tenderness: There is no abdominal tenderness. There is no guarding.  ?Musculoskeletal:  ?   Cervical back: Normal range of motion and neck supple. No rigidity.  ?Skin: ?   General: Skin is warm.  ?   Capillary Refill:  Capillary refill takes less than 2 seconds.  ?   Findings: No rash.  ?Neurological:  ?   General: No focal deficit present.  ?   Mental Status: She is alert.  ?   Cranial Nerves: No cranial nerve deficit.  ?Psychiatric:     ?   Mood and Affect: Mood normal.  ? ? ?ED Results / Procedures / Treatments   ?Labs ?(all labs ordered are listed, but only abnormal results are displayed) ?Labs Reviewed  ?GROUP A STREP BY PCR  ? ? ?EKG ?None ? ?Radiology ?No results found. ? ?Procedures ?Procedures  ? ? ?Medications Ordered in ED ?Medications - No data to display ? ?ED Course/ Medical Decision Making/ A&P ?  ?                        ?Medical Decision Making ? ?Patient presents with clinical concern for upper  restaurant infection likely viral in origin.  Patient also has sore throat plan to check strep.  Discussed likely viral process and supportive care.  Work note given. ? ?Strep test result reviewed negative. ? ?Patient stable for outpatient follow-up. ? ? ? ? ? ? ? ?Final Clinical Impression(s) / ED Diagnoses ?Final diagnoses:  ?Acute pharyngitis, unspecified etiology  ?Acute upper respiratory infection  ? ? ?Rx / DC Orders ?ED Discharge Orders   ? ? None  ? ?  ? ? ?  ?Elnora Morrison, MD ?05/14/21 1116 ? ?

## 2021-05-14 NOTE — ED Triage Notes (Signed)
Pt started yesterday with congestion and headache. Denies fevers.  ?

## 2021-05-15 ENCOUNTER — Telehealth: Payer: Self-pay

## 2021-05-15 NOTE — Telephone Encounter (Signed)
Transition Care Management Unsuccessful Follow-up Telephone Call ? ?Date of discharge and from where:  05/14/2021 from [facility] ? ?Attempts:  1st Attempt ? ?Reason for unsuccessful TCM follow-up call:  Voice mail full ? ? ? ?

## 2021-05-16 NOTE — Telephone Encounter (Signed)
Transition Care Management Follow-up Telephone Call ?Date of discharge and from where: 05/15/2021 from [facility] ?How have you been since you were released from the hospital? Patient stated that she is feeling some better. There is some congestion but not other complaints at this time. Patient stated that she see OBGYN for primary care needs.  ?Any questions or concerns? No ? ?Items Reviewed: ?Did the pt receive and understand the discharge instructions provided? Yes  ?Medications obtained and verified? Yes  ?Other? No  ?Any new allergies since your discharge? No  ?Dietary orders reviewed? No ?Do you have support at home? Yes  ? ?Functional Questionnaire: (I = Independent and D = Dependent) ?ADLs: I ? ?Bathing/Dressing- I ? ?Meal Prep- I ? ?Eating- I ? ?Maintaining continence- I ? ?Transferring/Ambulation- I ? ?Managing Meds- I ? ? ?Follow up appointments reviewed: ? ?PCP Hospital f/u appt confirmed? No   ?Specialist Hospital f/u appt confirmed? No   ?Are transportation arrangements needed? No  ?If their condition worsens, is the pt aware to call PCP or go to the Emergency Dept.? Yes ?Was the patient provided with contact information for the PCP's office or ED? Yes ?Was to pt encouraged to call back with questions or concerns? Yes ? ? ?

## 2021-07-22 ENCOUNTER — Encounter (HOSPITAL_COMMUNITY): Payer: Self-pay

## 2021-07-22 ENCOUNTER — Emergency Department (HOSPITAL_COMMUNITY)
Admission: EM | Admit: 2021-07-22 | Discharge: 2021-07-22 | Disposition: A | Discharge status: Home / Self Care | Payer: PRIVATE HEALTH INSURANCE | Attending: Emergency Medicine | Admitting: Emergency Medicine

## 2021-07-22 DIAGNOSIS — J028 Acute pharyngitis due to other specified organisms: Secondary | ICD-10-CM

## 2021-07-22 DIAGNOSIS — Z20822 Contact with and (suspected) exposure to covid-19: Secondary | ICD-10-CM | POA: Insufficient documentation

## 2021-07-22 DIAGNOSIS — J069 Acute upper respiratory infection, unspecified: Secondary | ICD-10-CM | POA: Insufficient documentation

## 2021-07-22 DIAGNOSIS — J029 Acute pharyngitis, unspecified: Secondary | ICD-10-CM | POA: Diagnosis present

## 2021-07-22 LAB — RESP PANEL BY RT-PCR (FLU A&B, COVID) ARPGX2
Influenza A by PCR: NEGATIVE
Influenza B by PCR: NEGATIVE
SARS Coronavirus 2 by RT PCR: NEGATIVE

## 2021-07-22 LAB — GROUP A STREP BY PCR: Group A Strep by PCR: NOT DETECTED

## 2021-07-22 MED ORDER — DEXAMETHASONE 2 MG PO TABS
2.0000 mg | ORAL_TABLET | Freq: Once | ORAL | Status: AC
  Administered 2021-07-22: 2 mg via ORAL
  Filled 2021-07-22: qty 1

## 2021-07-22 MED ORDER — ACETAMINOPHEN 325 MG PO TABS
650.0000 mg | ORAL_TABLET | Freq: Once | ORAL | Status: AC
  Administered 2021-07-22: 650 mg via ORAL
  Filled 2021-07-22: qty 2

## 2021-07-22 NOTE — Discharge Instructions (Signed)
As we discussed, your work-up in the ER today was reassuring for acute abnormalities.  Your strep, COVID, and flu swabs were all negative.  Given this, I strongly suspect that you have an upper respiratory infection.  As these are almost always viral in nature, no antibiotics are indicated.  I did give you a dose of steroids in the emergency department today to help with your sore throat and difficulty swallowing.  Additionally, you may try Flonase and Zyrtec which can get over-the-counter to help with additional symptoms as well as Cepacol throat lozenges to help with swallowing.  I also recommend taking Tylenol/ibuprofen as needed for fevers and pain.  Return if development of any new or worsening symptoms.

## 2021-07-22 NOTE — ED Provider Notes (Signed)
[facility]-EMERGENCY DEPT Provider Note   CSN: [id number] Arrival date & time: 07/22/21  1253     History  Chief Complaint  Patient presents with   Generalized Body Aches    Tammy Cruz is a 26 y.o. female.  Patient with no pertinent past medical history presents today with complaints of generalized bodyaches, subjective fevers, and sore throat. States that her son has been sick with similar symptoms and was diagnosed with adenovirus. States that her symptoms began when she woke up this morning. She subsequently took benadryl without relief. Denies any other medications prior to arrival. States that she has felt warm but has not checked her temperature at home. She is able to swallow without difficulty but with some pain. Denies any URI symptoms including cough, congestion, shortness of breath, or chest pain.  The history is provided by the patient. No language interpreter was used.       Home Medications Prior to Admission medications   Medication Sig Start Date End Date Taking? Authorizing Provider  amoxicillin (AMOXIL) 500 MG capsule Take 2 capsules daily starting tomorrow, 06/20/2020. Patient not taking: Reported on 09/08/2020 06/19/20   Molpus, John, MD  fluconazole (DIFLUCAN) 150 MG tablet Take 1 tablet as needed for vaginal yeast infection.  May repeat in 3 days if symptoms persist. Patient not taking: Reported on 09/08/2020 06/21/20   Virgina Norfolk, DO  metroNIDAZOLE (FLAGYL) 500 MG tablet Take 1 tablet (500 mg total) by mouth 2 (two) times daily. 05/07/21   Anyanwu, Jethro Bastos, MD  metroNIDAZOLE (METROGEL) 0.75 % vaginal gel Place 1 Applicatorful vaginally at bedtime. Apply one applicatorful to vagina at bedtime for 10 days, then twice a week for 6 months. 05/05/21   Anyanwu, Jethro Bastos, MD  dicyclomine (BENTYL) 20 MG tablet Take 1 tablet (20 mg total) by mouth 2 (two) times daily. 04/02/19 05/17/19  Joy, Ines Bloomer C, PA-C      Allergies    Protopic [tacrolimus]     Review of Systems   Review of Systems  HENT:  Positive for sore throat. Negative for trouble swallowing and voice change.   All other systems reviewed and are negative.   Physical Exam Updated Vital Signs BP 137/76   Pulse 98   Temp 99.5 F (37.5 C) (Oral)   Resp 18   SpO2 97%  Physical Exam Vitals and nursing note reviewed.  Constitutional:      General: She is not in acute distress.    Appearance: Normal appearance. She is normal weight. She is not ill-appearing, toxic-appearing or diaphoretic.  HENT:     Head: Normocephalic and atraumatic.     Nose: Nose normal.     Mouth/Throat:     Mouth: Mucous membranes are moist.     Pharynx: Oropharynx is clear. Uvula midline.     Tonsils: No tonsillar exudate or tonsillar abscesses. 2+ on the right. 2+ on the left.  Eyes:     Extraocular Movements: Extraocular movements intact.     Pupils: Pupils are equal, round, and reactive to light.  Cardiovascular:     Rate and Rhythm: Normal rate and regular rhythm.     Heart sounds: Normal heart sounds.  Pulmonary:     Effort: Pulmonary effort is normal. No respiratory distress.     Breath sounds: Normal breath sounds.  Musculoskeletal:        General: Normal range of motion.     Cervical back: Normal range of motion and neck supple.  Skin:  General: Skin is warm and dry.  Neurological:     General: No focal deficit present.     Mental Status: She is alert.  Psychiatric:        Mood and Affect: Mood normal.        Behavior: Behavior normal.     ED Results / Procedures / Treatments   Labs (all labs ordered are listed, but only abnormal results are displayed) Labs Reviewed  GROUP A STREP BY PCR  RESP PANEL BY RT-PCR (FLU A&B, COVID) [id number]    EKG None  Radiology No results found.  Procedures Procedures    Medications Ordered in ED Medications  acetaminophen (TYLENOL) tablet 650 mg (650 mg Oral Given 07/22/21 1446)  dexamethasone (DECADRON) tablet 2 mg (2 mg Oral  Given 07/22/21 1630)    ED Course/ Medical Decision Making/ A&P                           Medical Decision Making Risk OTC drugs. Prescription drug management.   Patient presents today with generalized body aches, subjective fevers, and sore throat.  She is afebrile, nontoxic-appearing, and in no acute distress with reassuring vital signs.  She states that her son was just diagnosed with adenovirus and she is concerned that she has the same.  On exam, her lungs are clear to auscultation in all fields therefore imaging not indicated at this time.  She is also able to eat and drink normally without any difficulty swallowing.  Strep, COVID, and flu swabs obtained all of which are negative.  Patients symptoms are consistent with URI, likely viral etiology. Discussed that antibiotics are not indicated for viral infections.  Given her tonsillar swelling, I did give her a dose of oral Decadron for management of same.  Pt will be discharged with symptomatic treatment.  Verbalizes understanding and is agreeable with plan.  Also educated on red flag symptoms that would prompt immediate return.  Pt is hemodynamically stable & in NAD prior to dc.  Discharged in stable condition.   Final Clinical Impression(s) / ED Diagnoses Final diagnoses:  Sore throat (viral)  Viral upper respiratory tract infection    Rx / DC Orders ED Discharge Orders     None     An After Visit Summary was printed and given to the patient.     Vear Clock 07/22/21 1640    Arby Barrette, MD 08/07/21 [phone]

## 2021-07-22 NOTE — ED Triage Notes (Signed)
Pt arrived via EMS, from home, c/o generalized body aches, fevers, chills, sore throat x12 hrs.

## 2021-07-22 NOTE — ED Provider Triage Note (Signed)
Emergency Medicine Provider Triage Evaluation Note  Tammy Tammy Cruz , a 26 y.o. female  was evaluated in triage.  Pt complains of generalized bodyaches, fevers, and sore throat. States that her son has been sick with similar symptoms and was diagnosed with adenovirus. She has been sick since today. Took benadryl but no other medications for symptoms. Has not checked temperature at home. She is able to swallow without difficulty.  Review of Systems  Positive:  Negative:   Physical Exam  BP 137/76   Pulse 98   Temp 99.5 F (37.5 C) (Oral)   Resp 18   SpO2 97%  Gen:   Awake, no distress   Resp:  Normal effort  MSK:   Moves extremities without difficulty  Other:  Tonsillar swelling without exudate  Medical Decision Making  Medically screening exam initiated at 1:25 PM.  Appropriate orders placed.  Tammy Tammy Cruz was informed that the remainder of the evaluation will be completed by another provider, this initial triage assessment does not replace that evaluation, and the importance of remaining in the ED until their evaluation is complete.     Tammy Bandy, PA-C 07/22/21 1328

## 2022-03-06 ENCOUNTER — Other Ambulatory Visit: Payer: Self-pay | Admitting: Family Medicine

## 2022-03-13 ENCOUNTER — Ambulatory Visit (HOSPITAL_COMMUNITY)
Admission: EM | Admit: 2022-03-13 | Discharge: 2022-03-13 | Disposition: A | Discharge status: Home / Self Care | Payer: PRIVATE HEALTH INSURANCE | Attending: Family Medicine | Admitting: Family Medicine

## 2022-03-13 ENCOUNTER — Encounter (HOSPITAL_COMMUNITY): Payer: Self-pay

## 2022-03-13 DIAGNOSIS — N898 Other specified noninflammatory disorders of vagina: Secondary | ICD-10-CM

## 2022-03-13 LAB — POCT URINALYSIS DIPSTICK, ED / UC
Bilirubin Urine: NEGATIVE
Glucose, UA: NEGATIVE mg/dL
Ketones, ur: NEGATIVE mg/dL
Nitrite: NEGATIVE
Protein, ur: NEGATIVE mg/dL
Specific Gravity, Urine: 1.015 (ref 1.005–1.030)
Urobilinogen, UA: 0.2 mg/dL (ref 0.0–1.0)
pH: 7.5 (ref 5.0–8.0)

## 2022-03-13 LAB — POC URINE PREG, ED: Preg Test, Ur: NEGATIVE

## 2022-03-13 MED ORDER — METRONIDAZOLE 500 MG PO TABS: 500.0000 mg | ORAL_TABLET | Freq: Two times a day (BID) | ORAL | 0 refills | Status: DC

## 2022-03-13 NOTE — Discharge Instructions (Signed)
You were seen today for vaginal symptoms.  The swab will be resulted tomorrow and you will be notified if anything is positive.  I will treat you for possible BV today. I will send the urine for culture as well.  This will be resulted in several days.  You will be notified if treatment is needed.  Your pregnancy test is negative.

## 2022-03-13 NOTE — ED Triage Notes (Signed)
Patient c/o of a lower abdominal pain, vaginal discharge, and a vaginal odor x 1 week.
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
## 2022-03-14 LAB — URINE CULTURE: Culture: NO GROWTH

## 2022-03-16 ENCOUNTER — Telehealth (HOSPITAL_COMMUNITY): Payer: Self-pay | Admitting: Emergency Medicine

## 2022-03-16 LAB — CERVICOVAGINAL ANCILLARY ONLY
Bacterial Vaginitis (gardnerella): POSITIVE — AB
Candida Glabrata: NEGATIVE
Candida Vaginitis: NEGATIVE
Chlamydia: NEGATIVE
Comment: NEGATIVE
Comment: NEGATIVE
Comment: NEGATIVE
Comment: NEGATIVE
Comment: NEGATIVE
Comment: NORMAL
Neisseria Gonorrhea: POSITIVE — AB
Trichomonas: NEGATIVE

## 2022-03-16 NOTE — Telephone Encounter (Signed)
Patient went home on Metronidazole Per protocol, patient will need treatment with IM Rocephin '500mg'$  for positive Gonorrhea.  Contacted patient by phone.  Verified identity using two identifiers.  Provided positive result.  Reviewed safe sex practices, notifying partners, and refraining from sexual activities for 7 days from time of treatment.  Patient verified understanding, all questions answered.

## 2022-03-17 ENCOUNTER — Ambulatory Visit (HOSPITAL_COMMUNITY)
Admission: EM | Admit: 2022-03-17 | Discharge: 2022-03-17 | Disposition: A | Discharge status: Home / Self Care | Payer: Self-pay | Attending: Emergency Medicine | Admitting: Emergency Medicine

## 2022-03-17 DIAGNOSIS — A549 Gonococcal infection, unspecified: Secondary | ICD-10-CM

## 2022-03-17 MED ORDER — CEFTRIAXONE SODIUM 500 MG IJ SOLR
INTRAMUSCULAR | Status: AC
  Filled 2022-03-17: qty 500

## 2022-03-17 MED ORDER — LIDOCAINE HCL (PF) 1 % IJ SOLN
INTRAMUSCULAR | Status: AC
  Filled 2022-03-17: qty 2

## 2022-03-17 MED ORDER — CEFTRIAXONE SODIUM 500 MG IJ SOLR
500.0000 mg | INTRAMUSCULAR | Status: DC
  Administered 2022-03-17: 500 mg via INTRAMUSCULAR

## 2022-03-17 NOTE — ED Triage Notes (Signed)
Pt here for STI treatment, she was advised to not have sexual activity for anther 7 days. She needs to let her partners know

## 2022-06-15 ENCOUNTER — Encounter: Payer: Self-pay | Admitting: Emergency Medicine

## 2022-06-15 ENCOUNTER — Ambulatory Visit
Admission: EM | Admit: 2022-06-15 | Discharge: 2022-06-15 | Disposition: A | Discharge status: Home / Self Care | Payer: Self-pay | Attending: Internal Medicine | Admitting: Internal Medicine

## 2022-06-15 ENCOUNTER — Other Ambulatory Visit: Payer: Self-pay

## 2022-06-15 DIAGNOSIS — J029 Acute pharyngitis, unspecified: Secondary | ICD-10-CM | POA: Insufficient documentation

## 2022-06-15 DIAGNOSIS — Z113 Encounter for screening for infections with a predominantly sexual mode of transmission: Secondary | ICD-10-CM | POA: Insufficient documentation

## 2022-06-15 DIAGNOSIS — R300 Dysuria: Secondary | ICD-10-CM | POA: Insufficient documentation

## 2022-06-15 LAB — POCT URINALYSIS DIP (MANUAL ENTRY)
Bilirubin, UA: NEGATIVE
Blood, UA: NEGATIVE
Glucose, UA: NEGATIVE mg/dL
Ketones, POC UA: NEGATIVE mg/dL
Leukocytes, UA: NEGATIVE
Nitrite, UA: NEGATIVE
Protein Ur, POC: NEGATIVE mg/dL
Spec Grav, UA: 1.015 (ref 1.010–1.025)
Urobilinogen, UA: 1 E.U./dL
pH, UA: 7.5 (ref 5.0–8.0)

## 2022-06-15 LAB — POCT RAPID STREP A (OFFICE): Rapid Strep A Screen: NEGATIVE

## 2022-06-15 LAB — POCT URINE PREGNANCY: Preg Test, Ur: NEGATIVE

## 2022-06-15 MED ORDER — CEFDINIR 300 MG PO CAPS: 300.0000 mg | ORAL_CAPSULE | Freq: Two times a day (BID) | ORAL | 0 refills | Status: AC

## 2022-06-15 NOTE — ED Triage Notes (Signed)
Last night, patient noticed throat irritated.  Patient woke this morning with generalized aches.  Patient has chills.   Took tylenol pm this morning.    Also requesting to do sti testing .  Burning sensation post urination, difficult holding urine, dribbling urine

## 2022-06-15 NOTE — ED Notes (Signed)
Patient requested sugar from staff several times for oatmeal, sugar provided

## 2022-06-15 NOTE — ED Notes (Addendum)
Repeatedly reminded patient to stop drinking iced coffee from [employer] so an accurate temperature could be obtained

## 2022-06-15 NOTE — ED Notes (Signed)
Patient left without paperwork or final discussion with provider

## 2022-06-15 NOTE — ED Notes (Signed)
Patient in hallway saying she is leaving

## 2022-06-15 NOTE — ED Notes (Signed)
Tried to explain that provider needed to talk to her about results or may have a need to do further testing or examination.  Patient then chose to converse with cma on duty and no longer acknowledged my explanations
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
## 2022-06-17 ENCOUNTER — Telehealth (HOSPITAL_COMMUNITY): Payer: Self-pay | Admitting: Emergency Medicine

## 2022-06-17 LAB — CERVICOVAGINAL ANCILLARY ONLY
Bacterial Vaginitis (gardnerella): POSITIVE — AB
Candida Glabrata: NEGATIVE
Candida Vaginitis: NEGATIVE
Chlamydia: NEGATIVE
Comment: NEGATIVE
Comment: NEGATIVE
Comment: NEGATIVE
Comment: NEGATIVE
Comment: NEGATIVE
Comment: NORMAL
Neisseria Gonorrhea: NEGATIVE
Trichomonas: NEGATIVE

## 2022-06-17 LAB — CULTURE, GROUP A STREP (THRC)

## 2022-06-17 MED ORDER — METRONIDAZOLE 500 MG PO TABS: 500.0000 mg | ORAL_TABLET | Freq: Two times a day (BID) | ORAL | 0 refills | Status: DC

## 2022-07-16 ENCOUNTER — Ambulatory Visit
Admission: EM | Admit: 2022-07-16 | Discharge: 2022-07-16 | Disposition: A | Discharge status: Home / Self Care | Payer: Self-pay | Attending: Family Medicine | Admitting: Family Medicine

## 2022-07-16 DIAGNOSIS — U071 COVID-19: Secondary | ICD-10-CM | POA: Insufficient documentation

## 2022-07-16 DIAGNOSIS — J069 Acute upper respiratory infection, unspecified: Secondary | ICD-10-CM | POA: Insufficient documentation

## 2022-07-16 DIAGNOSIS — J4521 Mild intermittent asthma with (acute) exacerbation: Secondary | ICD-10-CM | POA: Insufficient documentation

## 2022-07-16 MED ORDER — ALBUTEROL SULFATE HFA 108 (90 BASE) MCG/ACT IN AERS: 2.0000 | INHALATION_SPRAY | RESPIRATORY_TRACT | 0 refills | Status: AC | PRN

## 2022-07-16 MED ORDER — PREDNISONE 20 MG PO TABS: 40.0000 mg | ORAL_TABLET | Freq: Every day | ORAL | 0 refills | Status: AC

## 2022-07-16 NOTE — Discharge Instructions (Signed)
Albuterol inhaler--do 2 puffs every 4 hours as needed for shortness of breath or wheezing  Take prednisone 20 mg--2 daily for 3 days  Tylenol or ibuprofen as needed for fever  Make sure you are getting enough fluids in  If you feel weaker or getting more short of breath, then please proceed to the emergency room for further evaluation   You have been swabbed for COVID, and the test will result in the next 24 hours. Our staff will call you if positive. If the COVID test is positive, you should quarantine until you are fever free for 24 hours and you are starting to feel better, and then take added precautions for the next 5 days, such as physical distancing/wearing a mask and good hand hygiene/washing.
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
## 2022-07-16 NOTE — ED Triage Notes (Signed)
Patient here today with c/o LB radiating down both legs, SOB, HA, fatigue, dizziness, chills, dry cough, wheeze, and fever that started this morning. 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 One of her patients was positive for Covid and Pneumonia.

## 2022-07-17 LAB — SARS CORONAVIRUS 2 (TAT 6-24 HRS): SARS Coronavirus 2: POSITIVE — AB

## 2022-08-22 ENCOUNTER — Ambulatory Visit
Admission: EM | Admit: 2022-08-22 | Discharge: 2022-08-22 | Disposition: A | Discharge status: Home / Self Care | Payer: Self-pay | Attending: Physician Assistant | Admitting: Physician Assistant

## 2022-08-22 ENCOUNTER — Encounter: Payer: Self-pay | Admitting: Emergency Medicine

## 2022-08-22 DIAGNOSIS — J029 Acute pharyngitis, unspecified: Secondary | ICD-10-CM | POA: Insufficient documentation

## 2022-08-22 DIAGNOSIS — Z113 Encounter for screening for infections with a predominantly sexual mode of transmission: Secondary | ICD-10-CM | POA: Insufficient documentation

## 2022-08-22 LAB — POCT RAPID STREP A (OFFICE): Rapid Strep A Screen: NEGATIVE

## 2022-08-22 LAB — POCT MONO SCREEN (KUC): Mono, POC: NEGATIVE

## 2022-08-22 MED ORDER — AMOXICILLIN-POT CLAVULANATE 875-125 MG PO TABS: 1.0000 | ORAL_TABLET | Freq: Two times a day (BID) | ORAL | 0 refills | Status: AC
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
## 2022-08-22 NOTE — ED Triage Notes (Signed)
Reports eczema flare, swollen tonsils, and requesting STD testing. Reports hx of BV, but is now having a lot of yellowish colored discharged. Not on birth control, LMP:08/07/2022 with some light spotting in between.   Tonsils do appear swollen with white exudate x 3 days. Not taking any meds at home to try to manage symptoms.  Using triamcinolone cream at home with no improvement in eczema symptoms.

## 2022-08-23 LAB — RPR: RPR Ser Ql: NONREACTIVE

## 2022-08-23 LAB — HIV ANTIBODY (ROUTINE TESTING W REFLEX): HIV Screen 4th Generation wRfx: NONREACTIVE

## 2022-08-24 ENCOUNTER — Telehealth: Payer: Self-pay

## 2022-08-24 LAB — CERVICOVAGINAL ANCILLARY ONLY
Bacterial Vaginitis (gardnerella): NEGATIVE
Candida Glabrata: NEGATIVE
Candida Vaginitis: POSITIVE — AB
Chlamydia: NEGATIVE
Comment: NEGATIVE
Comment: NEGATIVE
Comment: NEGATIVE
Comment: NEGATIVE
Comment: NEGATIVE
Comment: NORMAL
Neisseria Gonorrhea: POSITIVE — AB
Trichomonas: POSITIVE — AB

## 2022-08-24 MED ORDER — METRONIDAZOLE 500 MG PO TABS: 500.0000 mg | ORAL_TABLET | Freq: Two times a day (BID) | ORAL | 0 refills | Status: AC

## 2022-08-24 MED ORDER — FLUCONAZOLE 150 MG PO TABS: 150.0000 mg | ORAL_TABLET | Freq: Once | ORAL | 0 refills | Status: AC

## 2022-08-24 NOTE — Telephone Encounter (Signed)
Per protocol pt will require tx with 500mg  Rocephin IM. Pt will also require tx with metronidazole and Diflcuan. Attempted to reach patient x1. Unable to LVM. Rx sent to pharmacy on file.

## 2022-09-02 ENCOUNTER — Ambulatory Visit
Admission: EM | Admit: 2022-09-02 | Discharge: 2022-09-02 | Disposition: A | Discharge status: Home / Self Care | Payer: Self-pay | Attending: Internal Medicine | Admitting: Internal Medicine

## 2022-09-02 ENCOUNTER — Telehealth: Payer: Self-pay | Admitting: *Deleted

## 2022-09-02 DIAGNOSIS — A549 Gonococcal infection, unspecified: Secondary | ICD-10-CM

## 2022-09-02 MED ORDER — CEFTRIAXONE SODIUM 500 MG IJ SOLR
500.0000 mg | Freq: Once | INTRAMUSCULAR | Status: AC
  Administered 2022-09-02: 500 mg via INTRAMUSCULAR

## 2022-09-02 NOTE — Discharge Instructions (Signed)
No sexual activity for a MINIMUM of 7 days following injection. Please notify any sexual partner(s) so they can be treated as well. We recommend condom use to prevent sexually transmitted infections in the future.

## 2022-09-02 NOTE — Telephone Encounter (Signed)
Pt state she is going to come to [facility] today for Rocephin injection to treat her gonorrhea infection from 08/24/22, as she was notified per RN. Pt confirms she filled the other 2 Rxs that were sent in, and took those, she just needs the treatment for gonorrhea. Confirmed with pt she is welcome to come in tonight.

## 2022-09-02 NOTE — ED Triage Notes (Signed)
Pt presents for ceftriaxone injection to treat for gonorrhea per protocol.

## 2022-10-11 ENCOUNTER — Ambulatory Visit
Admission: EM | Admit: 2022-10-11 | Discharge: 2022-10-11 | Disposition: A | Discharge status: Home / Self Care | Payer: Self-pay | Attending: Internal Medicine | Admitting: Internal Medicine

## 2022-10-11 DIAGNOSIS — L209 Atopic dermatitis, unspecified: Secondary | ICD-10-CM

## 2022-10-11 MED ORDER — TRIAMCINOLONE ACETONIDE 0.1 % EX CREA: 1.0000 | TOPICAL_CREAM | Freq: Two times a day (BID) | CUTANEOUS | 0 refills | Status: AC

## 2022-10-11 MED ORDER — PREDNISONE 20 MG PO TABS: 40.0000 mg | ORAL_TABLET | Freq: Every day | ORAL | 0 refills | Status: AC

## 2022-10-11 NOTE — ED Triage Notes (Signed)
Patient presents with "inflamed eczema" on bilateral ears, arms, face and neck x 2-3. Treating with Triamcinolone 0.5 and ran out.
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
## 2022-10-11 NOTE — Discharge Instructions (Signed)
I have refilled your triamcinolone and prescribed you prednisone to help alleviate eczema flareup.  Follow-up with dermatologist and PCP.
